# Patient Record
Sex: Female | Born: 1970 | Race: White | Hispanic: No | Marital: Married | Smoking: Current every day smoker
Health system: Southern US, Community
[De-identification: ages and names within clinical notes are randomized; demographics above are authoritative.]

## PROBLEM LIST (undated history)

## (undated) DIAGNOSIS — R52 Pain, unspecified: Secondary | ICD-10-CM

## (undated) DIAGNOSIS — F319 Bipolar disorder, unspecified: Secondary | ICD-10-CM

## (undated) DIAGNOSIS — F39 Unspecified mood [affective] disorder: Secondary | ICD-10-CM

## (undated) DIAGNOSIS — E7800 Pure hypercholesterolemia, unspecified: Secondary | ICD-10-CM

## (undated) DIAGNOSIS — M549 Dorsalgia, unspecified: Secondary | ICD-10-CM

## (undated) DIAGNOSIS — I10 Essential (primary) hypertension: Secondary | ICD-10-CM

## (undated) DIAGNOSIS — E119 Type 2 diabetes mellitus without complications: Secondary | ICD-10-CM

## (undated) DIAGNOSIS — F419 Anxiety disorder, unspecified: Secondary | ICD-10-CM

## (undated) DIAGNOSIS — M5090 Cervical disc disorder, unspecified, unspecified cervical region: Secondary | ICD-10-CM

## (undated) HISTORY — DX: Anxiety disorder, unspecified: F41.9

## (undated) HISTORY — PX: CHOLECYSTECTOMY: SHX55

## (undated) HISTORY — PX: TUBAL LIGATION: SHX77

## (undated) HISTORY — DX: Bipolar disorder, unspecified: F31.9

## (undated) HISTORY — DX: Type 2 diabetes mellitus without complications: E11.9

---

## 2009-08-18 ENCOUNTER — Emergency Department (HOSPITAL_COMMUNITY): Admission: EM | Admit: 2009-08-18 | Discharge: 2009-08-19 | Payer: Self-pay | Admitting: Emergency Medicine

## 2009-12-26 ENCOUNTER — Emergency Department (HOSPITAL_COMMUNITY): Admission: EM | Admit: 2009-12-26 | Discharge: 2009-12-26 | Payer: Self-pay | Admitting: Emergency Medicine

## 2010-01-16 ENCOUNTER — Emergency Department (HOSPITAL_COMMUNITY): Admission: EM | Admit: 2010-01-16 | Discharge: 2010-01-16 | Payer: Self-pay | Admitting: Emergency Medicine

## 2010-02-14 ENCOUNTER — Encounter: Admission: RE | Admit: 2010-02-14 | Discharge: 2010-02-14 | Payer: Self-pay | Admitting: *Deleted

## 2010-02-15 ENCOUNTER — Emergency Department (HOSPITAL_COMMUNITY): Admission: EM | Admit: 2010-02-15 | Discharge: 2010-02-15 | Payer: Self-pay | Admitting: Emergency Medicine

## 2010-02-21 ENCOUNTER — Emergency Department (HOSPITAL_COMMUNITY): Admission: EM | Admit: 2010-02-21 | Discharge: 2010-02-21 | Payer: Self-pay | Admitting: Emergency Medicine

## 2010-04-29 ENCOUNTER — Emergency Department (HOSPITAL_COMMUNITY): Admission: EM | Admit: 2010-04-29 | Discharge: 2010-04-30 | Payer: Self-pay | Admitting: Emergency Medicine

## 2010-04-29 ENCOUNTER — Emergency Department (HOSPITAL_COMMUNITY): Admission: EM | Admit: 2010-04-29 | Discharge: 2010-04-29 | Payer: Self-pay | Admitting: Family Medicine

## 2010-05-12 ENCOUNTER — Emergency Department (HOSPITAL_COMMUNITY): Admission: EM | Admit: 2010-05-12 | Discharge: 2010-05-12 | Payer: Self-pay | Admitting: Emergency Medicine

## 2010-09-09 ENCOUNTER — Emergency Department (HOSPITAL_COMMUNITY)
Admission: EM | Admit: 2010-09-09 | Discharge: 2010-09-09 | Payer: Self-pay | Source: Home / Self Care | Admitting: Emergency Medicine

## 2010-10-06 ENCOUNTER — Encounter: Payer: Self-pay | Admitting: Orthopedic Surgery

## 2010-11-22 ENCOUNTER — Emergency Department (HOSPITAL_COMMUNITY): Payer: Medicare Other

## 2010-11-22 ENCOUNTER — Emergency Department (HOSPITAL_COMMUNITY)
Admission: EM | Admit: 2010-11-22 | Discharge: 2010-11-23 | Disposition: A | Discharge status: Home / Self Care | Payer: Medicare Other | Attending: Emergency Medicine | Admitting: Emergency Medicine

## 2010-11-22 DIAGNOSIS — F319 Bipolar disorder, unspecified: Secondary | ICD-10-CM | POA: Insufficient documentation

## 2010-11-22 DIAGNOSIS — Y92009 Unspecified place in unspecified non-institutional (private) residence as the place of occurrence of the external cause: Secondary | ICD-10-CM | POA: Insufficient documentation

## 2010-11-22 DIAGNOSIS — M25579 Pain in unspecified ankle and joints of unspecified foot: Secondary | ICD-10-CM | POA: Insufficient documentation

## 2010-11-22 DIAGNOSIS — E119 Type 2 diabetes mellitus without complications: Secondary | ICD-10-CM | POA: Insufficient documentation

## 2010-11-22 DIAGNOSIS — W010XXA Fall on same level from slipping, tripping and stumbling without subsequent striking against object, initial encounter: Secondary | ICD-10-CM | POA: Insufficient documentation

## 2010-11-22 DIAGNOSIS — E7800 Pure hypercholesterolemia, unspecified: Secondary | ICD-10-CM | POA: Insufficient documentation

## 2010-11-22 DIAGNOSIS — M79609 Pain in unspecified limb: Secondary | ICD-10-CM | POA: Insufficient documentation

## 2010-11-22 DIAGNOSIS — M129 Arthropathy, unspecified: Secondary | ICD-10-CM | POA: Insufficient documentation

## 2010-11-22 DIAGNOSIS — Z794 Long term (current) use of insulin: Secondary | ICD-10-CM | POA: Insufficient documentation

## 2010-11-22 DIAGNOSIS — F341 Dysthymic disorder: Secondary | ICD-10-CM | POA: Insufficient documentation

## 2010-11-29 LAB — POCT URINALYSIS DIPSTICK
Bilirubin Urine: NEGATIVE
Glucose, UA: NEGATIVE mg/dL
pH: 6 (ref 5.0–8.0)

## 2010-11-29 LAB — GC/CHLAMYDIA PROBE AMP, GENITAL: GC Probe Amp, Genital: NEGATIVE

## 2010-12-21 ENCOUNTER — Emergency Department (HOSPITAL_COMMUNITY)
Admission: EM | Admit: 2010-12-21 | Discharge: 2010-12-21 | Disposition: A | Discharge status: Home / Self Care | Payer: Medicare Other | Attending: Emergency Medicine | Admitting: Emergency Medicine

## 2010-12-21 DIAGNOSIS — M549 Dorsalgia, unspecified: Secondary | ICD-10-CM | POA: Insufficient documentation

## 2010-12-21 DIAGNOSIS — E7800 Pure hypercholesterolemia, unspecified: Secondary | ICD-10-CM | POA: Insufficient documentation

## 2010-12-21 DIAGNOSIS — Z794 Long term (current) use of insulin: Secondary | ICD-10-CM | POA: Insufficient documentation

## 2010-12-21 DIAGNOSIS — D500 Iron deficiency anemia secondary to blood loss (chronic): Secondary | ICD-10-CM | POA: Insufficient documentation

## 2010-12-21 DIAGNOSIS — IMO0002 Reserved for concepts with insufficient information to code with codable children: Secondary | ICD-10-CM | POA: Insufficient documentation

## 2010-12-21 DIAGNOSIS — E119 Type 2 diabetes mellitus without complications: Secondary | ICD-10-CM | POA: Insufficient documentation

## 2010-12-21 DIAGNOSIS — F319 Bipolar disorder, unspecified: Secondary | ICD-10-CM | POA: Insufficient documentation

## 2011-02-13 ENCOUNTER — Emergency Department (HOSPITAL_COMMUNITY)
Admission: EM | Admit: 2011-02-13 | Discharge: 2011-02-13 | Disposition: A | Discharge status: Home / Self Care | Payer: Medicare Other | Attending: Emergency Medicine | Admitting: Emergency Medicine

## 2011-02-13 DIAGNOSIS — E119 Type 2 diabetes mellitus without complications: Secondary | ICD-10-CM | POA: Insufficient documentation

## 2011-02-13 DIAGNOSIS — M542 Cervicalgia: Secondary | ICD-10-CM | POA: Insufficient documentation

## 2011-02-13 DIAGNOSIS — M5412 Radiculopathy, cervical region: Secondary | ICD-10-CM | POA: Insufficient documentation

## 2011-02-13 DIAGNOSIS — F17200 Nicotine dependence, unspecified, uncomplicated: Secondary | ICD-10-CM | POA: Insufficient documentation

## 2011-02-13 LAB — GLUCOSE, CAPILLARY: Glucose-Capillary: 286 mg/dL — ABNORMAL HIGH (ref 70–99)

## 2011-04-23 ENCOUNTER — Emergency Department (HOSPITAL_COMMUNITY)
Admission: EM | Admit: 2011-04-23 | Discharge: 2011-04-23 | Disposition: A | Discharge status: Home / Self Care | Payer: Medicare Other | Attending: Emergency Medicine | Admitting: Emergency Medicine

## 2011-04-23 DIAGNOSIS — M542 Cervicalgia: Secondary | ICD-10-CM | POA: Insufficient documentation

## 2011-04-23 DIAGNOSIS — F17200 Nicotine dependence, unspecified, uncomplicated: Secondary | ICD-10-CM | POA: Insufficient documentation

## 2011-04-23 DIAGNOSIS — G8929 Other chronic pain: Secondary | ICD-10-CM | POA: Insufficient documentation

## 2011-04-23 HISTORY — DX: Cervical disc disorder, unspecified, unspecified cervical region: M50.90

## 2011-04-23 MED ORDER — HYDROCODONE-ACETAMINOPHEN 5-325 MG PO TABS: ORAL_TABLET | ORAL | Status: AC

## 2011-04-23 MED ORDER — METHOCARBAMOL 500 MG PO TABS: 500.0000 mg | ORAL_TABLET | Freq: Three times a day (TID) | ORAL | Status: AC

## 2011-04-23 MED ORDER — MORPHINE SULFATE 10 MG/ML IJ SOLN
6.0000 mg | Freq: Once | INTRAMUSCULAR | Status: AC
  Administered 2011-04-23: 6 mg via INTRAMUSCULAR
  Filled 2011-04-23: qty 1

## 2011-04-23 MED ORDER — ONDANSETRON 8 MG PO TBDP
8.0000 mg | ORAL_TABLET | Freq: Once | ORAL | Status: AC
  Administered 2011-04-23: 8 mg via ORAL
  Filled 2011-04-23: qty 1

## 2011-04-23 NOTE — ED Provider Notes (Signed)
History     CSN: [id number] Arrival date & time: 04/23/2011  3:49 PM  Chief Complaint  Patient presents with  . Pain   HPI Comments: Patient c/o chronic neck pain with hx of recent MR of C spine in June that showed a "bulging disc" per patient.  States the pain became worse today after she was reaching up to hang clothes on a clothesline.  States pain is on the left side of her neck and radiates to her shoulder.  She denies fall, numbness, weakness, headaches or chest pain.  Patient is a 40 y.o. female presenting with musculoskeletal pain. The history is provided by the patient.  Muscle Pain This is a recurrent problem. The current episode started today. The problem occurs constantly. The problem has been gradually worsening. Associated symptoms include neck pain. Pertinent negatives include no abdominal pain, arthralgias, change in bowel habit, chest pain, diaphoresis, fever, headaches, joint swelling, nausea, numbness, sore throat, swollen glands, visual change, vomiting or weakness. The symptoms are aggravated by twisting (certain movements). She has tried oral narcotics for the symptoms. The treatment provided mild relief.    Past Medical History  Diagnosis Date  . Disc disorder of cervical region     History reviewed. No pertinent past surgical history.  History reviewed. No pertinent family history.  History  Substance Use Topics  . Smoking status: Current Everyday Smoker    Types: Cigarettes  . Smokeless tobacco: Not on file  . Alcohol Use: No    OB History    Grav Para Term Preterm Abortions TAB SAB Ect Mult Living                  Review of Systems  Constitutional: Negative for fever and diaphoresis.  HENT: Positive for neck pain. Negative for sore throat, neck stiffness and tinnitus.   Respiratory: Negative for chest tightness and shortness of breath.   Cardiovascular: Negative for chest pain.  Gastrointestinal: Negative for nausea, vomiting, abdominal pain and  change in bowel habit.  Musculoskeletal: Negative for back pain, joint swelling, arthralgias and gait problem.  Skin: Negative.   Neurological: Negative for dizziness, weakness, numbness and headaches.  Hematological: Does not bruise/bleed easily.  All other systems reviewed and are negative.    Physical Exam  BP 145/88  Pulse 102  Temp(Src) 97.9 F (36.6 C) (Oral)  Resp 20  Ht 5\' 4"  (1.626 m)  Wt 180 lb (81.647 kg)  BMI 30.90 kg/m2  SpO2 98%  LMP 04/17/2011  Physical Exam  Nursing note and vitals reviewed. Constitutional: She is oriented to person, place, and time. She appears well-developed and well-nourished. No distress.  HENT:  Head: Normocephalic and atraumatic.  Mouth/Throat: Oropharynx is clear and moist.  Eyes: EOM are normal. Pupils are equal, round, and reactive to light.  Neck: Normal range of motion, full passive range of motion without pain and phonation normal. No JVD present. Muscular tenderness present. No spinous process tenderness present. No rigidity. No edema present. No Brudzinski's sign and no Kernig's sign noted. No thyromegaly present.  Cardiovascular: Normal rate and regular rhythm.   Pulmonary/Chest: Effort normal. No respiratory distress. She exhibits no tenderness.  Musculoskeletal: She exhibits tenderness. She exhibits no edema.  Lymphadenopathy:    She has no cervical adenopathy.  Neurological: She is alert and oriented to person, place, and time. She has normal reflexes. She displays normal reflexes. No cranial nerve deficit. She exhibits normal muscle tone. Coordination normal.  Skin: Skin is warm and dry.  ED Course  Procedures  MDM   I have reveiwed pt's MRI c-spine  results from 02/14/10 that shows a disc prortusion on left at C 5-6 w/o cord compression.  Today, pt has ttp of the left cervical paraspinal muscles that radiates to the shoulder.  No weakness or numbness to left arm. NO posterior cervical spine tenderness Distal sensation  intact, CR<2 sec.  Radial pulse is strong bilaterally.  Grips strength is equal.  Pt has full ROM if the left arm.      Sourish Allender L. Bernard, [place] 04/28/11 2027

## 2011-04-23 NOTE — ED Notes (Signed)
Complain of neck pain past hanging up clothes this morning

## 2011-05-03 ENCOUNTER — Emergency Department (HOSPITAL_COMMUNITY): Payer: Medicare Other

## 2011-05-03 ENCOUNTER — Emergency Department (HOSPITAL_COMMUNITY)
Admission: EM | Admit: 2011-05-03 | Discharge: 2011-05-03 | Disposition: A | Discharge status: Home / Self Care | Payer: Medicare Other | Attending: Emergency Medicine | Admitting: Emergency Medicine

## 2011-05-03 DIAGNOSIS — M546 Pain in thoracic spine: Secondary | ICD-10-CM | POA: Insufficient documentation

## 2011-05-03 DIAGNOSIS — S139XXA Sprain of joints and ligaments of unspecified parts of neck, initial encounter: Secondary | ICD-10-CM | POA: Insufficient documentation

## 2011-05-03 DIAGNOSIS — E669 Obesity, unspecified: Secondary | ICD-10-CM | POA: Insufficient documentation

## 2011-05-03 DIAGNOSIS — E119 Type 2 diabetes mellitus without complications: Secondary | ICD-10-CM | POA: Insufficient documentation

## 2011-05-03 DIAGNOSIS — F209 Schizophrenia, unspecified: Secondary | ICD-10-CM | POA: Insufficient documentation

## 2011-05-03 DIAGNOSIS — E7800 Pure hypercholesterolemia, unspecified: Secondary | ICD-10-CM | POA: Insufficient documentation

## 2011-05-03 DIAGNOSIS — R079 Chest pain, unspecified: Secondary | ICD-10-CM | POA: Insufficient documentation

## 2011-05-03 DIAGNOSIS — J984 Other disorders of lung: Secondary | ICD-10-CM | POA: Insufficient documentation

## 2011-05-03 DIAGNOSIS — F319 Bipolar disorder, unspecified: Secondary | ICD-10-CM | POA: Insufficient documentation

## 2011-05-07 ENCOUNTER — Emergency Department (HOSPITAL_COMMUNITY): Payer: Medicare Other

## 2011-05-07 ENCOUNTER — Encounter (HOSPITAL_COMMUNITY): Payer: Self-pay | Admitting: *Deleted

## 2011-05-07 ENCOUNTER — Emergency Department (HOSPITAL_COMMUNITY)
Admission: EM | Admit: 2011-05-07 | Discharge: 2011-05-07 | Disposition: A | Discharge status: Home / Self Care | Payer: Medicare Other | Attending: Emergency Medicine | Admitting: Emergency Medicine

## 2011-05-07 ENCOUNTER — Other Ambulatory Visit: Payer: Self-pay

## 2011-05-07 DIAGNOSIS — F39 Unspecified mood [affective] disorder: Secondary | ICD-10-CM | POA: Insufficient documentation

## 2011-05-07 DIAGNOSIS — I10 Essential (primary) hypertension: Secondary | ICD-10-CM | POA: Insufficient documentation

## 2011-05-07 DIAGNOSIS — S20219A Contusion of unspecified front wall of thorax, initial encounter: Secondary | ICD-10-CM

## 2011-05-07 DIAGNOSIS — E7800 Pure hypercholesterolemia, unspecified: Secondary | ICD-10-CM | POA: Insufficient documentation

## 2011-05-07 DIAGNOSIS — E119 Type 2 diabetes mellitus without complications: Secondary | ICD-10-CM | POA: Insufficient documentation

## 2011-05-07 DIAGNOSIS — S301XXA Contusion of abdominal wall, initial encounter: Secondary | ICD-10-CM | POA: Insufficient documentation

## 2011-05-07 DIAGNOSIS — R51 Headache: Secondary | ICD-10-CM | POA: Insufficient documentation

## 2011-05-07 DIAGNOSIS — R079 Chest pain, unspecified: Secondary | ICD-10-CM | POA: Insufficient documentation

## 2011-05-07 DIAGNOSIS — Z794 Long term (current) use of insulin: Secondary | ICD-10-CM | POA: Insufficient documentation

## 2011-05-07 DIAGNOSIS — Y92410 Unspecified street and highway as the place of occurrence of the external cause: Secondary | ICD-10-CM | POA: Insufficient documentation

## 2011-05-07 HISTORY — DX: Unspecified mood (affective) disorder: F39

## 2011-05-07 HISTORY — DX: Pure hypercholesterolemia, unspecified: E78.00

## 2011-05-07 HISTORY — DX: Essential (primary) hypertension: I10

## 2011-05-07 LAB — CBC
HCT: 45.6 % (ref 36.0–46.0)
Platelets: 178 10*3/uL (ref 150–400)
RDW: 12.4 % (ref 11.5–15.5)
WBC: 8.4 10*3/uL (ref 4.0–10.5)

## 2011-05-07 LAB — COMPREHENSIVE METABOLIC PANEL
ALT: 52 U/L — ABNORMAL HIGH (ref 0–35)
AST: 47 U/L — ABNORMAL HIGH (ref 0–37)
CO2: 27 mEq/L (ref 19–32)
Chloride: 97 mEq/L (ref 96–112)
Sodium: 133 mEq/L — ABNORMAL LOW (ref 135–145)
Total Bilirubin: 0.3 mg/dL (ref 0.3–1.2)

## 2011-05-07 LAB — POCT I-STAT, CHEM 8
Calcium, Ion: 1.18 mmol/L (ref 1.12–1.32)
Chloride: 101 mEq/L (ref 96–112)
Glucose, Bld: 215 mg/dL — ABNORMAL HIGH (ref 70–99)
HCT: 48 % — ABNORMAL HIGH (ref 36.0–46.0)
Hemoglobin: 16.3 g/dL — ABNORMAL HIGH (ref 12.0–15.0)

## 2011-05-07 LAB — DIFFERENTIAL
Basophils Absolute: 0 10*3/uL (ref 0.0–0.1)
Lymphocytes Relative: 35 % (ref 12–46)
Neutro Abs: 4.6 10*3/uL (ref 1.7–7.7)

## 2011-05-07 MED ORDER — HYDROMORPHONE HCL 1 MG/ML IJ SOLN
1.0000 mg | Freq: Once | INTRAMUSCULAR | Status: AC
  Administered 2011-05-07: 1 mg via INTRAVENOUS
  Filled 2011-05-07: qty 1

## 2011-05-07 MED ORDER — SODIUM CHLORIDE 0.9 % IV SOLN
Freq: Once | INTRAVENOUS | Status: AC
  Administered 2011-05-07: 14:00:00 via INTRAVENOUS

## 2011-05-07 MED ORDER — ONDANSETRON HCL 4 MG/2ML IJ SOLN
4.0000 mg | Freq: Once | INTRAMUSCULAR | Status: AC
  Administered 2011-05-07: 4 mg via INTRAVENOUS
  Filled 2011-05-07: qty 2

## 2011-05-07 MED ORDER — IOHEXOL 300 MG/ML  SOLN
100.0000 mL | Freq: Once | INTRAMUSCULAR | Status: AC | PRN
  Administered 2011-05-07: 100 mL via INTRAVENOUS

## 2011-05-07 MED ORDER — HYDROMORPHONE HCL 2 MG PO TABS: 2.0000 mg | ORAL_TABLET | ORAL | Status: AC | PRN
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
## 2011-05-07 NOTE — ED Provider Notes (Signed)
Medical screening examination/treatment/procedure(s) were performed by non-physician practitioner and as supervising physician I was immediately available for consultation/collaboration.  Blaike Vickers K Rosangela Fehrenbach-Rasch, MD 05/07/11 0441 

## 2011-05-07 NOTE — ED Notes (Signed)
Pt to CT

## 2011-05-15 ENCOUNTER — Emergency Department (HOSPITAL_COMMUNITY)
Admission: EM | Admit: 2011-05-15 | Discharge: 2011-05-15 | Disposition: A | Discharge status: Home / Self Care | Payer: Medicare Other | Attending: Emergency Medicine | Admitting: Emergency Medicine

## 2011-05-15 ENCOUNTER — Emergency Department (HOSPITAL_COMMUNITY): Payer: Medicare Other

## 2011-05-15 ENCOUNTER — Encounter (HOSPITAL_COMMUNITY): Payer: Self-pay | Admitting: Emergency Medicine

## 2011-05-15 DIAGNOSIS — I10 Essential (primary) hypertension: Secondary | ICD-10-CM | POA: Insufficient documentation

## 2011-05-15 DIAGNOSIS — Y92410 Unspecified street and highway as the place of occurrence of the external cause: Secondary | ICD-10-CM | POA: Insufficient documentation

## 2011-05-15 DIAGNOSIS — Z79899 Other long term (current) drug therapy: Secondary | ICD-10-CM | POA: Insufficient documentation

## 2011-05-15 DIAGNOSIS — E7800 Pure hypercholesterolemia, unspecified: Secondary | ICD-10-CM | POA: Insufficient documentation

## 2011-05-15 DIAGNOSIS — E119 Type 2 diabetes mellitus without complications: Secondary | ICD-10-CM | POA: Insufficient documentation

## 2011-05-15 DIAGNOSIS — S2220XA Unspecified fracture of sternum, initial encounter for closed fracture: Secondary | ICD-10-CM

## 2011-05-15 MED ORDER — HYDROMORPHONE HCL 4 MG PO TABS: 4.0000 mg | ORAL_TABLET | ORAL | Status: DC | PRN

## 2011-05-15 MED ORDER — CYCLOBENZAPRINE HCL 10 MG PO TABS: 10.0000 mg | ORAL_TABLET | Freq: Two times a day (BID) | ORAL | Status: AC | PRN

## 2011-05-15 MED ORDER — HYDROMORPHONE HCL 2 MG PO TABS: 4.0000 mg | ORAL_TABLET | Freq: Four times a day (QID) | ORAL | Status: DC | PRN

## 2011-05-15 MED ORDER — HYDROMORPHONE HCL 2 MG/ML IJ SOLN
2.0000 mg | Freq: Once | INTRAMUSCULAR | Status: AC
  Administered 2011-05-15: 2 mg via INTRAVENOUS
  Filled 2011-05-15: qty 1
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
## 2011-06-04 ENCOUNTER — Emergency Department (HOSPITAL_COMMUNITY)
Admission: EM | Admit: 2011-06-04 | Discharge: 2011-06-04 | Disposition: A | Discharge status: Home / Self Care | Payer: Medicare Other | Attending: Emergency Medicine | Admitting: Emergency Medicine

## 2011-06-04 ENCOUNTER — Encounter (HOSPITAL_COMMUNITY): Payer: Self-pay | Admitting: *Deleted

## 2011-06-04 ENCOUNTER — Emergency Department (HOSPITAL_COMMUNITY): Payer: Medicare Other

## 2011-06-04 DIAGNOSIS — I10 Essential (primary) hypertension: Secondary | ICD-10-CM | POA: Insufficient documentation

## 2011-06-04 DIAGNOSIS — E7800 Pure hypercholesterolemia, unspecified: Secondary | ICD-10-CM | POA: Insufficient documentation

## 2011-06-04 DIAGNOSIS — M5090 Cervical disc disorder, unspecified, unspecified cervical region: Secondary | ICD-10-CM | POA: Insufficient documentation

## 2011-06-04 DIAGNOSIS — R072 Precordial pain: Secondary | ICD-10-CM | POA: Insufficient documentation

## 2011-06-04 DIAGNOSIS — E119 Type 2 diabetes mellitus without complications: Secondary | ICD-10-CM | POA: Insufficient documentation

## 2011-06-04 DIAGNOSIS — R0789 Other chest pain: Secondary | ICD-10-CM

## 2011-06-04 DIAGNOSIS — Z794 Long term (current) use of insulin: Secondary | ICD-10-CM | POA: Insufficient documentation

## 2011-06-04 MED ORDER — MORPHINE SULFATE 10 MG/ML IJ SOLN
8.0000 mg | Freq: Once | INTRAMUSCULAR | Status: AC
  Administered 2011-06-04: 8 mg via INTRAMUSCULAR
  Filled 2011-06-04: qty 1

## 2011-06-04 MED ORDER — PROMETHAZINE HCL 25 MG/ML IJ SOLN
25.0000 mg | Freq: Once | INTRAMUSCULAR | Status: AC
  Administered 2011-06-04: 25 mg via INTRAVENOUS
  Filled 2011-06-04: qty 1

## 2011-06-04 MED ORDER — OXYCODONE-ACETAMINOPHEN 5-325 MG PO TABS: 2.0000 | ORAL_TABLET | ORAL | Status: DC | PRN

## 2011-06-04 MED ORDER — OXYCODONE-ACETAMINOPHEN 5-325 MG PO TABS: 2.0000 | ORAL_TABLET | ORAL | Status: AC | PRN
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
## 2011-07-28 ENCOUNTER — Emergency Department (HOSPITAL_COMMUNITY)
Admission: EM | Admit: 2011-07-28 | Discharge: 2011-07-28 | Disposition: A | Discharge status: Home / Self Care | Payer: Medicare Other | Attending: Emergency Medicine | Admitting: Emergency Medicine

## 2011-07-28 ENCOUNTER — Other Ambulatory Visit: Payer: Self-pay

## 2011-07-28 ENCOUNTER — Encounter (HOSPITAL_COMMUNITY): Payer: Self-pay | Admitting: Emergency Medicine

## 2011-07-28 ENCOUNTER — Emergency Department (HOSPITAL_COMMUNITY): Payer: Medicare Other

## 2011-07-28 DIAGNOSIS — Z87828 Personal history of other (healed) physical injury and trauma: Secondary | ICD-10-CM | POA: Insufficient documentation

## 2011-07-28 DIAGNOSIS — F39 Unspecified mood [affective] disorder: Secondary | ICD-10-CM | POA: Insufficient documentation

## 2011-07-28 DIAGNOSIS — Z794 Long term (current) use of insulin: Secondary | ICD-10-CM | POA: Insufficient documentation

## 2011-07-28 DIAGNOSIS — E7800 Pure hypercholesterolemia, unspecified: Secondary | ICD-10-CM | POA: Insufficient documentation

## 2011-07-28 DIAGNOSIS — E119 Type 2 diabetes mellitus without complications: Secondary | ICD-10-CM | POA: Insufficient documentation

## 2011-07-28 DIAGNOSIS — R0789 Other chest pain: Secondary | ICD-10-CM | POA: Insufficient documentation

## 2011-07-28 DIAGNOSIS — M5090 Cervical disc disorder, unspecified, unspecified cervical region: Secondary | ICD-10-CM | POA: Insufficient documentation

## 2011-07-28 DIAGNOSIS — I10 Essential (primary) hypertension: Secondary | ICD-10-CM | POA: Insufficient documentation

## 2011-07-28 LAB — BASIC METABOLIC PANEL
BUN: 6 mg/dL (ref 6–23)
Calcium: 9 mg/dL (ref 8.4–10.5)
GFR calc Af Amer: >90 mL/min (ref >90)
GFR calc non Af Amer: >90 mL/min (ref >90)
Potassium: 3.6 mEq/L (ref 3.5–5.1)

## 2011-07-28 LAB — CBC
HCT: 45.2 % (ref 36.0–46.0)
MCHC: 34.7 g/dL (ref 30.0–36.0)
RDW: 12.4 % (ref 11.5–15.5)

## 2011-07-28 LAB — POCT I-STAT TROPONIN I: Troponin i, poc: 0 ng/mL (ref 0.00–0.08)

## 2011-07-28 MED ORDER — HYDROCODONE-ACETAMINOPHEN 5-325 MG PO TABS: 1.0000 | ORAL_TABLET | ORAL | Status: AC | PRN

## 2011-07-28 MED ORDER — IBUPROFEN 800 MG PO TABS: 800.0000 mg | ORAL_TABLET | Freq: Three times a day (TID) | ORAL | Status: AC

## 2011-07-28 MED ORDER — HYDROCODONE-ACETAMINOPHEN 5-325 MG PO TABS
1.0000 | ORAL_TABLET | Freq: Once | ORAL | Status: AC
  Administered 2011-07-28: 1 via ORAL
  Filled 2011-07-28: qty 1

## 2011-07-28 MED ORDER — IBUPROFEN 800 MG PO TABS
800.0000 mg | ORAL_TABLET | Freq: Once | ORAL | Status: AC
  Administered 2011-07-28: 800 mg via ORAL
  Filled 2011-07-28: qty 1

## 2011-07-28 NOTE — ED Notes (Signed)
Pt back from xray, nad noted
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
## 2011-07-28 NOTE — ED Notes (Signed)
Pt c/o chest pain since yesterday, pt describes pain as a heaviness and feels sob at times.

## 2011-08-29 ENCOUNTER — Encounter (HOSPITAL_COMMUNITY): Payer: Self-pay

## 2011-08-29 ENCOUNTER — Emergency Department (HOSPITAL_COMMUNITY)
Admission: EM | Admit: 2011-08-29 | Discharge: 2011-08-30 | Disposition: A | Discharge status: Home / Self Care | Payer: Medicare Other | Attending: Emergency Medicine | Admitting: Emergency Medicine

## 2011-08-29 DIAGNOSIS — Z794 Long term (current) use of insulin: Secondary | ICD-10-CM | POA: Insufficient documentation

## 2011-08-29 DIAGNOSIS — M5090 Cervical disc disorder, unspecified, unspecified cervical region: Secondary | ICD-10-CM | POA: Insufficient documentation

## 2011-08-29 DIAGNOSIS — M79604 Pain in right leg: Secondary | ICD-10-CM

## 2011-08-29 DIAGNOSIS — Z9889 Other specified postprocedural states: Secondary | ICD-10-CM | POA: Insufficient documentation

## 2011-08-29 DIAGNOSIS — E7800 Pure hypercholesterolemia, unspecified: Secondary | ICD-10-CM | POA: Insufficient documentation

## 2011-08-29 DIAGNOSIS — F39 Unspecified mood [affective] disorder: Secondary | ICD-10-CM | POA: Insufficient documentation

## 2011-08-29 DIAGNOSIS — M79609 Pain in unspecified limb: Secondary | ICD-10-CM | POA: Insufficient documentation

## 2011-08-29 DIAGNOSIS — I10 Essential (primary) hypertension: Secondary | ICD-10-CM | POA: Insufficient documentation

## 2011-08-29 DIAGNOSIS — E119 Type 2 diabetes mellitus without complications: Secondary | ICD-10-CM | POA: Insufficient documentation

## 2011-08-29 DIAGNOSIS — F17200 Nicotine dependence, unspecified, uncomplicated: Secondary | ICD-10-CM | POA: Insufficient documentation

## 2011-08-29 MED ORDER — HYDROMORPHONE HCL PF 2 MG/ML IJ SOLN
2.0000 mg | Freq: Once | INTRAMUSCULAR | Status: AC
  Administered 2011-08-29: 2 mg via INTRAMUSCULAR
  Filled 2011-08-29: qty 1

## 2011-08-29 MED ORDER — OXYCODONE-ACETAMINOPHEN 5-325 MG PO TABS: 2.0000 | ORAL_TABLET | ORAL | Status: AC | PRN

## 2011-08-29 NOTE — ED Notes (Signed)
Pt states her pain management doctor was supposed to call in a medication for pain today but did not.

## 2011-08-29 NOTE — ED Notes (Signed)
Pain bil legs from hips to knees. No known injury.  Says her MD was supposed to call out meds for pain , but did not.  Alert, NAD, color good.

## 2011-08-29 NOTE — ED Notes (Signed)
Pt presents with bilateral leg pain. Pt states pain starts at hip and radiates to knees. Pt ambulated with steady gate.

## 2011-08-29 NOTE — ED Provider Notes (Signed)
Scribed for Donnetta Hutching, MD, the patient was seen in room APA03/APA03 . This chart was scribed by Ellie Lunch.   CSN: [id number] Arrival date & time: 08/29/2011  9:26 PM   First MD Initiated Contact with Patient 08/29/11 2136      Chief Complaint  Patient presents with  . Leg Pain    (Consider location/radiation/quality/duration/timing/severity/associated sxs/prior treatment) HPI .Jaclyn Cruz is a 40 y.o. female who presents to the Emergency Department complaining of 2 days of bilateral leg pain. Pain is located at hips and radiates to knees. Reports some associated lower back pain. Denies any other pain. Denies recent fever. Pt treats pain with Lyrica with mild improvement.  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  PCP [facility] and Dr. Gerilyn Pilgrim for pain management.   Past Medical History  Diagnosis Date  . Disc disorder of cervical region   . Hypertension   . High cholesterol   . Mood disorder   . Diabetes mellitus     Past Surgical History  Procedure Date  . Cholecystectomy     No family history on file.  History  Substance Use Topics  . Smoking status: Current Everyday Smoker    Types: Cigarettes  . Smokeless tobacco: Not on file  . Alcohol Use: No     Review of Systems 10 Systems reviewed and are negative for acute change except as noted in the HPI.  Allergies  Wheat; Soybean-containing drug products; Tape; Codeine; and Compazine  Home Medications   Current Outpatient Rx  Name Route Sig Dispense Refill  . CARISOPRODOL 350 MG PO TABS Oral Take 350 mg by mouth every 6 (six) hours.      Marland Kitchen VITAMIN D 1000 UNITS PO CAPS Oral Take 1,000 Units by mouth daily.      Marland Kitchen CLONAZEPAM 0.5 MG PO TABS Oral Take 0.5 mg by mouth 2 (two) times daily.      . INSULIN GLARGINE 100 UNIT/ML [place] SOLN Subcutaneous Inject 60 Units into the skin at bedtime.      . INSULIN LISPRO (HUMAN) 100 UNIT/ML [place] SOLN Subcutaneous Inject 10-15 Units into  the skin daily as needed. Per sliding scale. Pt will not exceed 15 units.     . MULTIVITAMIN PO Oral Take 1 tablet by mouth daily.      Marland Kitchen NAPROXEN 500 MG PO TABS Oral Take 500 mg by mouth daily as needed. For pain     . PREGABALIN 150 MG PO CAPS Oral Take 150 mg by mouth 2 (two) times daily.        BP 137/76  Pulse 108  Temp(Src) 98.3 F (36.8 C) (Oral)  Resp 20  Ht 5\' 4"  (1.626 m)  Wt 190 lb (86.183 kg)  BMI 32.61 kg/m2  SpO2 97%  LMP 08/29/2011  Physical Exam  Nursing note and vitals reviewed. Constitutional: She is oriented to person, place, and time. She appears well-developed and well-nourished.  HENT:  Head: Normocephalic and atraumatic.  Eyes: Conjunctivae and EOM are normal. Pupils are equal, round, and reactive to light.  Neck: Normal range of motion. Neck supple.  Cardiovascular: Normal rate and regular rhythm.   Pulmonary/Chest: Effort normal and breath sounds normal.  Abdominal: Soft. Bowel sounds are normal.  Musculoskeletal: Normal range of motion.       Minimal tenderness to bilateral legs from hips to knees.   Neurological: She is alert and oriented to person, place, and time. Coordination and gait normal.  Skin:  Skin is warm and dry.  Psychiatric: She has a normal mood and affect.    ED Course  Procedures (including critical care time) DIAGNOSTIC STUDIES: Oxygen Saturation is 97% on room air, normal by my interpretation.    COORDINATION OF CARE:  Labs Reviewed - No data to display No results found.  9:51 PM EDP at Pt bedside. Discussed plan to treat pain.   No diagnosis found.    MDM  Patient is ambulatory. No neuro deficits. Bowel bladder incontinence. No clinical evidence of DVT  I personally performed the services described in this documentation, which was scribed in my presence. The recorded information has been reviewed and considered.        Donnetta Hutching, MD 08/29/11 [phone]

## 2011-10-18 ENCOUNTER — Encounter (HOSPITAL_COMMUNITY): Payer: Self-pay | Admitting: *Deleted

## 2011-10-18 ENCOUNTER — Emergency Department (HOSPITAL_COMMUNITY): Payer: Medicare Other

## 2011-10-18 ENCOUNTER — Emergency Department (HOSPITAL_COMMUNITY)
Admission: EM | Admit: 2011-10-18 | Discharge: 2011-10-19 | Disposition: A | Discharge status: Home / Self Care | Payer: Medicare Other | Attending: Emergency Medicine | Admitting: Emergency Medicine

## 2011-10-18 DIAGNOSIS — M542 Cervicalgia: Secondary | ICD-10-CM | POA: Insufficient documentation

## 2011-10-18 DIAGNOSIS — E119 Type 2 diabetes mellitus without complications: Secondary | ICD-10-CM | POA: Insufficient documentation

## 2011-10-18 DIAGNOSIS — M25519 Pain in unspecified shoulder: Secondary | ICD-10-CM | POA: Insufficient documentation

## 2011-10-18 DIAGNOSIS — I10 Essential (primary) hypertension: Secondary | ICD-10-CM | POA: Insufficient documentation

## 2011-10-18 DIAGNOSIS — M5090 Cervical disc disorder, unspecified, unspecified cervical region: Secondary | ICD-10-CM | POA: Insufficient documentation

## 2011-10-18 DIAGNOSIS — E7800 Pure hypercholesterolemia, unspecified: Secondary | ICD-10-CM | POA: Insufficient documentation

## 2011-10-18 MED ORDER — HYDROMORPHONE HCL PF 1 MG/ML IJ SOLN
1.0000 mg | Freq: Once | INTRAMUSCULAR | Status: AC
  Administered 2011-10-18: 1 mg via INTRAMUSCULAR
  Filled 2011-10-18: qty 1

## 2011-10-18 MED ORDER — KETOROLAC TROMETHAMINE 60 MG/2ML IM SOLN
60.0000 mg | Freq: Once | INTRAMUSCULAR | Status: AC
  Administered 2011-10-18: 60 mg via INTRAMUSCULAR
  Filled 2011-10-18: qty 2

## 2011-10-18 NOTE — ED Provider Notes (Signed)
This chart was scribed for [employer]. Colon Branch, MD by Williemae Natter. The patient was seen in room APA05/APA05 at 10:45 PM.  CSN: [id number]  Arrival date & time 10/18/11  2200   First MD Initiated Contact with Patient 10/18/11 2241      Chief Complaint  Patient presents with  . Shoulder Pain    (Consider location/radiation/quality/duration/timing/severity/associated sxs/prior treatment) HPI Jaclyn Cruz is a 41 y.o. female who presents to the Emergency Department complaining of moderate to severe acute onset shoulder pain for the past 5 days. Pt states that the pain radiated down her arm to her elbow over the past few days and has progressively gotten worse. Pt states that she heard a loud "pop" sound in left arm today. Currently treating with Vicodin, tiger balm, and Biofreeze with little to no improvement. Past Medical History  Diagnosis Date  . Disc disorder of cervical region   . Hypertension   . High cholesterol   . Mood disorder   . Diabetes mellitus     Past Surgical History  Procedure Date  . Cholecystectomy     History reviewed. No pertinent family history.  History  Substance Use Topics  . Smoking status: Current Everyday Smoker    Types: Cigarettes  . Smokeless tobacco: Not on file  . Alcohol Use: No    OB History    Grav Para Term Preterm Abortions TAB SAB Ect Mult Living                  Review of Systems 10 Systems reviewed and are negative for acute change except as noted in the HPI.  Allergies  Wheat; Soybean-containing drug products; Tape; Codeine; and Compazine  Home Medications   Current Outpatient Rx  Name Route Sig Dispense Refill  . CARISOPRODOL 350 MG PO TABS Oral Take 350 mg by mouth every 6 (six) hours.      Marland Kitchen CLONAZEPAM 0.5 MG PO TABS Oral Take 0.5 mg by mouth 2 (two) times daily.      Marland Kitchen HYDROCODONE-ACETAMINOPHEN 5-325 MG PO TABS Oral Take 1 tablet by mouth daily.    . INSULIN GLARGINE 100 UNIT/ML [place] SOLN Subcutaneous Inject 60  Units into the skin at bedtime.      . INSULIN LISPRO (HUMAN) 100 UNIT/ML [place] SOLN Subcutaneous Inject 10-15 Units into the skin daily as needed. Per sliding scale. Pt will not exceed 15 units.     . MULTIVITAMIN PO Oral Take 1 tablet by mouth daily.      Marland Kitchen PREGABALIN 300 MG PO CAPS Oral Take 300 mg by mouth 2 (two) times daily.      BP 137/90  Pulse 109  Temp(Src) 97.3 F (36.3 C) (Oral)  Resp 24  Ht 5\' 4"  (1.626 m)  Wt 180 lb (81.647 kg)  BMI 30.90 kg/m2  SpO2 97%  LMP 09/30/2011  Physical Exam  Nursing note and vitals reviewed. Constitutional: She is oriented to person, place, and time. She appears well-developed and well-nourished.  HENT:  Head: Normocephalic and atraumatic.  Eyes: Conjunctivae are normal. Pupils are equal, round, and reactive to light.  Neck: Normal range of motion. Neck supple.  Cardiovascular: Normal rate, regular rhythm and normal heart sounds.        Pulses are 2+ and normal. Brisk cap refill. No change in sensation  Pulmonary/Chest: Effort normal and breath sounds normal.  Abdominal: Soft. There is no tenderness.  Musculoskeletal:       Full range of motion in right shoulder  SCM muscle tenderness and deltoid tenderness to palpation.   Neurological: She is alert and oriented to person, place, and time.  Skin: Skin is warm and dry.    ED Course  Procedures (including critical care time) DIAGNOSTIC STUDIES: Oxygen Saturation is 97% on room air, normal by my interpretation.    COORDINATION OF CARE:  Medications  HYDROmorphone (DILAUDID) injection 1 mg   ketorolac (TORADOL) injection 60 mg    Results for orders placed during the hospital encounter of 07/28/11  CBC      Component Value Range   WBC 8.5  4.0 - 10.5 (K/uL)   RBC 4.93  3.87 - 5.11 (MIL/uL)   Hemoglobin 15.7 (*) 12.0 - 15.0 (g/dL)   HCT 16.1  09.6 - 04.5 (%)   MCV 91.7  78.0 - 100.0 (fL)   MCH 31.8  26.0 - 34.0 (pg)   MCHC 34.7  30.0 - 36.0 (g/dL)   RDW 40.9  81.1 - 91.4 (%)    Platelets 179  150 - 400 (K/uL)  BASIC METABOLIC PANEL      Component Value Range   Sodium 133 (*) 135 - 145 (mEq/L)   Potassium 3.6  3.5 - 5.1 (mEq/L)   Chloride 100  96 - 112 (mEq/L)   CO2 23  19 - 32 (mEq/L)   Glucose, Bld 217 (*) 70 - 99 (mg/dL)   BUN 6  6 - 23 (mg/dL)   Creatinine, Ser 7.82 (*) 0.50 - 1.10 (mg/dL)   Calcium 9.0  8.4 - 95.6 (mg/dL)   GFR calc non Af Amer >90  >90 (mL/min)   GFR calc Af Amer >90  >90 (mL/min)  POCT I-STAT TROPONIN I      Component Value Range   Troponin i, poc 0.00  0.00 - 0.08 (ng/mL)   Comment 3            Dg Shoulder Left  10/18/2011  *RADIOLOGY REPORT*  Clinical Data: Left shoulder pain  LEFT SHOULDER - 2+ VIEW  Comparison: None.  Findings: No acute fracture or dislocation.  No aggressive osseous lesions.  The clavicle is intact.  The left upper lung is clear. The left upper ribs are intact.  IMPRESSION: No acute osseous abnormality.  Original Report Authenticated By: Waneta Martins, M.D.      MDM  Patient with left neck, shoulder,  and arm pain with no known injury. Xray with no acute findings.Patient received analgesics and antiinflammatory with improvement.Pt feels improved after observation and/or treatment in ED.Pt stable in ED with no significant deterioration in condition.The patient appears reasonably screened and/or stabilized for discharge and I doubt any other medical condition or other [facility] requiring further screening, evaluation, or treatment in the ED at this time prior to discharge.  I personally performed the services described in this documentation, which was scribed in my presence. The recorded information has been reviewed and considered.  MDM Reviewed: nursing note and vitals Interpretation: x-ray       Nicoletta Dress. Colon Branch, MD 10/19/11 2130

## 2011-10-18 NOTE — ED Notes (Signed)
Left sided neck pain that started 5 days ago and has moved down into shoulder and left elbow over the last several days, also with cough

## 2011-10-18 NOTE — ED Notes (Signed)
Also heard a loud pop noise from her left arm today

## 2011-10-19 MED ORDER — CYCLOBENZAPRINE HCL 10 MG PO TABS: 10.0000 mg | ORAL_TABLET | Freq: Two times a day (BID) | ORAL | Status: AC

## 2011-10-19 MED ORDER — OXYCODONE-ACETAMINOPHEN 5-325 MG PO TABS: 2.0000 | ORAL_TABLET | ORAL | Status: AC | PRN

## 2012-01-03 ENCOUNTER — Emergency Department (HOSPITAL_COMMUNITY)
Admission: EM | Admit: 2012-01-03 | Discharge: 2012-01-03 | Disposition: A | Discharge status: Home / Self Care | Payer: Medicare Other | Attending: Emergency Medicine | Admitting: Emergency Medicine

## 2012-01-03 ENCOUNTER — Encounter (HOSPITAL_COMMUNITY): Payer: Self-pay | Admitting: *Deleted

## 2012-01-03 DIAGNOSIS — I10 Essential (primary) hypertension: Secondary | ICD-10-CM | POA: Insufficient documentation

## 2012-01-03 DIAGNOSIS — M542 Cervicalgia: Secondary | ICD-10-CM | POA: Insufficient documentation

## 2012-01-03 DIAGNOSIS — M25569 Pain in unspecified knee: Secondary | ICD-10-CM | POA: Insufficient documentation

## 2012-01-03 DIAGNOSIS — Z794 Long term (current) use of insulin: Secondary | ICD-10-CM | POA: Insufficient documentation

## 2012-01-03 DIAGNOSIS — M7989 Other specified soft tissue disorders: Secondary | ICD-10-CM | POA: Insufficient documentation

## 2012-01-03 DIAGNOSIS — M1990 Unspecified osteoarthritis, unspecified site: Secondary | ICD-10-CM

## 2012-01-03 DIAGNOSIS — E7800 Pure hypercholesterolemia, unspecified: Secondary | ICD-10-CM | POA: Insufficient documentation

## 2012-01-03 DIAGNOSIS — E119 Type 2 diabetes mellitus without complications: Secondary | ICD-10-CM | POA: Insufficient documentation

## 2012-01-03 MED ORDER — KETOROLAC TROMETHAMINE 10 MG PO TABS: 10.0000 mg | ORAL_TABLET | Freq: Once | ORAL | Status: DC

## 2012-01-03 MED ORDER — MORPHINE SULFATE 4 MG/ML IJ SOLN
8.0000 mg | Freq: Once | INTRAMUSCULAR | Status: AC
  Administered 2012-01-03: 8 mg via INTRAMUSCULAR
  Filled 2012-01-03: qty 2

## 2012-01-03 MED ORDER — ONDANSETRON HCL 4 MG PO TABS
4.0000 mg | ORAL_TABLET | Freq: Once | ORAL | Status: AC
  Administered 2012-01-03: 4 mg via ORAL
  Filled 2012-01-03: qty 1

## 2012-01-03 NOTE — Discharge Instructions (Signed)
Please use the knee immobilizer when up an about until seen by orthopedic MD listed above or Orthopedic MD of your choice. Please do not sleep in the immobilizer. Continue your current pain management meds.Degenerative Arthritis You have osteoarthritis. This is the wear and tear arthritis that comes with aging. It is also called degenerative arthritis. This is common in people past middle age. It is caused by stress on the joints. The large weight bearing joints of the lower extremities are most often affected. The knees, hips, back, neck, and hands can become painful, swollen, and stiff. This is the most common type of arthritis. It comes on with age, carrying too much weight, or from an injury. Treatment includes resting the sore joint until the pain and swelling improve. Crutches or a walker may be needed for severe flares. Only take over-the-counter or prescription medicines for pain, discomfort, or fever as directed by your caregiver. Local heat therapy may improve motion. Cortisone shots into the joint are sometimes used to reduce pain and swelling during flares. Osteoarthritis is usually not crippling and progresses slowly. There are things you can do to decrease pain:  Avoid high impact activities.   Exercise regularly.   Low impact exercises such as walking, biking and swimming help to keep the muscles strong and keep normal joint function.   Stretching helps to keep your range of motion.   Lose weight if you are overweight. This reduces joint stress.  In severe cases when you have pain at rest or increasing disability, joint surgery may be helpful. See your caregiver for follow-up treatment as recommended.  SEEK IMMEDIATE MEDICAL CARE IF:   You have severe joint pain.   Marked swelling and redness in your joint develops.   You develop a high fever.  Document Released: 09/01/2005 Document Revised: 08/21/2011 Document Reviewed: 02/01/2007 [facility] Patient Information 2012 [place],  [place].

## 2012-01-03 NOTE — ED Notes (Signed)
Pt presents with right knee pain, denies injury/trauma.Pt has iced and elevated knee without relief. Taking 1 Vicodin, muscle relaxants and neurotin daily without pain relief. Pt has pain management appointment on 26 th of April.

## 2012-01-03 NOTE — ED Provider Notes (Signed)
History     CSN: [id number]  Arrival date & time 01/03/12  2151   First MD Initiated Contact with Patient 01/03/12 2233      Chief Complaint  Patient presents with  . Knee Pain    (Consider location/radiation/quality/duration/timing/severity/associated sxs/prior treatment) Patient is a 41 y.o. female presenting with knee pain. The history is provided by the patient.  Knee Pain This is a chronic problem. The current episode started more than 1 month ago. The problem occurs daily. The problem has been gradually worsening. Associated symptoms include joint swelling and neck pain. The symptoms are aggravated by standing and walking.    Past Medical History  Diagnosis Date  . Disc disorder of cervical region   . Hypertension   . High cholesterol   . Mood disorder   . Diabetes mellitus     Past Surgical History  Procedure Date  . Cholecystectomy     No family history on file.  History  Substance Use Topics  . Smoking status: Current Everyday Smoker    Types: Cigarettes  . Smokeless tobacco: Not on file  . Alcohol Use: No    OB History    Grav Para Term Preterm Abortions TAB SAB Ect Mult Living                  Review of Systems  HENT: Positive for neck pain.   Musculoskeletal: Positive for joint swelling.    Allergies  Wheat; Soybean-containing drug products; Tape; Codeine; and Compazine  Home Medications   Current Outpatient Rx  Name Route Sig Dispense Refill  . CARISOPRODOL 350 MG PO TABS Oral Take 350 mg by mouth every 6 (six) hours.      Marland Kitchen CLONAZEPAM 0.5 MG PO TABS Oral Take 0.5 mg by mouth 2 (two) times daily.      Marland Kitchen HYDROCODONE-ACETAMINOPHEN 5-325 MG PO TABS Oral Take 1 tablet by mouth daily.    . INSULIN GLARGINE 100 UNIT/ML [place] SOLN Subcutaneous Inject 60 Units into the skin at bedtime.      . INSULIN LISPRO (HUMAN) 100 UNIT/ML [place] SOLN Subcutaneous Inject 10-15 Units into the skin daily as needed. Per sliding scale. Pt will not exceed 15 units.       . MULTIVITAMIN PO Oral Take 1 tablet by mouth daily.      Marland Kitchen PREGABALIN 300 MG PO CAPS Oral Take 300 mg by mouth 2 (two) times daily.      BP 149/100  Pulse 119  Temp(Src) 98.7 F (37.1 C) (Oral)  Resp 20  Ht 5\' 4"  (1.626 m)  Wt 190 lb (86.183 kg)  BMI 32.61 kg/m2  SpO2 98%  LMP 12/15/2011  Physical Exam  Nursing note and vitals reviewed. Constitutional: She is oriented to person, place, and time. She appears well-developed and well-nourished.  Non-toxic appearance.  HENT:  Head: Normocephalic.  Right Ear: Tympanic membrane and external ear normal.  Left Ear: Tympanic membrane and external ear normal.  Eyes: EOM and lids are normal. Pupils are equal, round, and reactive to light.  Neck: Normal range of motion. Neck supple. Carotid bruit is not present.  Cardiovascular: Normal rate, regular rhythm, normal heart sounds, intact distal pulses and normal pulses.   Pulmonary/Chest: Breath sounds normal. No respiratory distress.  Abdominal: Soft. Bowel sounds are normal. There is no tenderness. There is no guarding.  Musculoskeletal: Normal range of motion.       There is good ROM of the right hip. Pain with flex/extention of the right  knee. No significant effusion. Crepitus noted.. No posterior mass. Distal pulses and sensory wnl.  Lymphadenopathy:       Head (right side): No submandibular adenopathy present.       Head (left side): No submandibular adenopathy present.    She has no cervical adenopathy.  Neurological: She is alert and oriented to person, place, and time. She has normal strength. No cranial nerve deficit or sensory deficit.  Skin: Skin is warm and dry.  Psychiatric: She has a normal mood and affect. Her speech is normal.    ED Course  Procedures (including critical care time)  Labs Reviewed - No data to display No results found.   No diagnosis found.    MDM  I have reviewed nursing notes, vital signs, and all appropriate lab and imaging results for this  patient.* Pt states she has had problem with her knees for some time. He doctors have not given her a diagnosis concerning the knees. She has chronic pain and is treated at a pain management center. Pt is fitted with a knee immobilizer and crutches. IM Morphine given to pt tonight. She is to continue her current pain management until seen by orthopedics.       Kathie Dike, [place] 01/03/12 2314

## 2012-01-03 NOTE — ED Provider Notes (Signed)
Medical screening examination/treatment/procedure(s) were performed by non-physician practitioner and as supervising physician I was immediately available for consultation/collaboration.   Reigna Ruperto L Reginaldo Hazard, MD 01/03/12 2353 

## 2012-05-05 ENCOUNTER — Emergency Department (HOSPITAL_COMMUNITY)
Admission: EM | Admit: 2012-05-05 | Discharge: 2012-05-05 | Disposition: A | Discharge status: Home / Self Care | Payer: Medicare Other | Attending: Emergency Medicine | Admitting: Emergency Medicine

## 2012-05-05 ENCOUNTER — Encounter (HOSPITAL_COMMUNITY): Payer: Self-pay | Admitting: Emergency Medicine

## 2012-05-05 DIAGNOSIS — S91309A Unspecified open wound, unspecified foot, initial encounter: Secondary | ICD-10-CM | POA: Insufficient documentation

## 2012-05-05 DIAGNOSIS — W269XXA Contact with unspecified sharp object(s), initial encounter: Secondary | ICD-10-CM | POA: Insufficient documentation

## 2012-05-05 DIAGNOSIS — I10 Essential (primary) hypertension: Secondary | ICD-10-CM | POA: Insufficient documentation

## 2012-05-05 DIAGNOSIS — T7840XA Allergy, unspecified, initial encounter: Secondary | ICD-10-CM

## 2012-05-05 DIAGNOSIS — Z79899 Other long term (current) drug therapy: Secondary | ICD-10-CM | POA: Insufficient documentation

## 2012-05-05 DIAGNOSIS — Z23 Encounter for immunization: Secondary | ICD-10-CM | POA: Insufficient documentation

## 2012-05-05 DIAGNOSIS — E119 Type 2 diabetes mellitus without complications: Secondary | ICD-10-CM | POA: Insufficient documentation

## 2012-05-05 DIAGNOSIS — F17200 Nicotine dependence, unspecified, uncomplicated: Secondary | ICD-10-CM | POA: Insufficient documentation

## 2012-05-05 DIAGNOSIS — Z794 Long term (current) use of insulin: Secondary | ICD-10-CM | POA: Insufficient documentation

## 2012-05-05 DIAGNOSIS — S91339A Puncture wound without foreign body, unspecified foot, initial encounter: Secondary | ICD-10-CM

## 2012-05-05 DIAGNOSIS — R1310 Dysphagia, unspecified: Secondary | ICD-10-CM | POA: Insufficient documentation

## 2012-05-05 DIAGNOSIS — E7800 Pure hypercholesterolemia, unspecified: Secondary | ICD-10-CM | POA: Insufficient documentation

## 2012-05-05 MED ORDER — DIPHENHYDRAMINE HCL 25 MG PO TABS: 25.0000 mg | ORAL_TABLET | Freq: Four times a day (QID) | ORAL | Status: DC | PRN

## 2012-05-05 MED ORDER — METHYLPREDNISOLONE SODIUM SUCC 125 MG IJ SOLR
125.0000 mg | Freq: Once | INTRAMUSCULAR | Status: AC
  Administered 2012-05-05: 125 mg via INTRAVENOUS
  Filled 2012-05-05: qty 2

## 2012-05-05 MED ORDER — FAMOTIDINE IN NACL 20-0.9 MG/50ML-% IV SOLN
20.0000 mg | INTRAVENOUS | Status: AC
  Administered 2012-05-05: 20 mg via INTRAVENOUS
  Filled 2012-05-05: qty 50

## 2012-05-05 MED ORDER — DIPHENHYDRAMINE HCL 50 MG/ML IJ SOLN
25.0000 mg | Freq: Once | INTRAMUSCULAR | Status: AC
  Administered 2012-05-05: 25 mg via INTRAVENOUS
  Filled 2012-05-05: qty 1

## 2012-05-05 MED ORDER — TETANUS-DIPHTH-ACELL PERTUSSIS 5-2.5-18.5 LF-MCG/0.5 IM SUSP
0.5000 mL | Freq: Once | INTRAMUSCULAR | Status: AC
  Administered 2012-05-05: 0.5 mL via INTRAMUSCULAR
  Filled 2012-05-05: qty 0.5

## 2012-05-05 NOTE — ED Provider Notes (Signed)
History     CSN: [id number]  Arrival date & time 05/05/12  1502   First MD Initiated Contact with Patient 05/05/12 1525      Chief Complaint  Patient presents with  . Snake Bite    (Consider location/radiation/quality/duration/timing/severity/associated sxs/prior treatment) HPI Comments: Jaclyn Cruz presents for evaluation of burning left foot pain.  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  She stepped on something sharp and immediately felt a burning pain on the plantar surface of her foot that has spread to include the entire foot now.  She also reports feeling flushed, with dry mouth and some difficulty swallowing.  She denies any swelling except in her foot, rashes, or shortness of breath.  She did not see any metal objects, insects, snakes or woody debris that may have caused the injury.  She denies ever having a similar reaction in the past.  The history is provided by the patient. No language interpreter was used.    Past Medical History  Diagnosis Date  . Disc disorder of cervical region   . Hypertension   . High cholesterol   . Mood disorder   . Diabetes mellitus     Past Surgical History  Procedure Date  . Cholecystectomy     No family history on file.  History  Substance Use Topics  . Smoking status: Current Everyday Smoker    Types: Cigarettes  . Smokeless tobacco: Not on file  . Alcohol Use: Yes    OB History    Grav Para Term Preterm Abortions TAB SAB Ect Mult Living                  Review of Systems  Constitutional: Negative for fever, chills, diaphoresis, activity change, appetite change and fatigue.  HENT: Positive for trouble swallowing. Negative for congestion, sore throat, facial swelling, rhinorrhea, drooling, neck pain, voice change, sinus pressure and tinnitus.   Eyes: Negative.   Respiratory: Negative.   Cardiovascular: Negative.   Gastrointestinal: Negative.   Genitourinary: Negative.     Musculoskeletal: Negative for back pain, joint swelling, arthralgias and gait problem.  Skin: Negative for color change, pallor, rash and wound.  Neurological: Negative.   Hematological: Negative.   Psychiatric/Behavioral: Negative.     Allergies  Wheat; Soybean-containing drug products; Tape; Codeine; and Compazine  Home Medications   Current Outpatient Rx  Name Route Sig Dispense Refill  . CLONAZEPAM 0.5 MG PO TABS Oral Take 0.5 mg by mouth 2 (two) times daily.      . CYCLOBENZAPRINE HCL 10 MG PO TABS Oral Take 10 mg by mouth 3 (three) times daily.    . DULOXETINE HCL 30 MG PO CPEP Oral Take 30 mg by mouth 2 (two) times daily.    . INSULIN GLARGINE 100 UNIT/ML [place] SOLN Subcutaneous Inject 60 Units into the skin at bedtime.     . INSULIN LISPRO (HUMAN) 100 UNIT/ML [place] SOLN Subcutaneous Inject 16-22 Units into the skin 3 (three) times daily before meals. Per sliding scale. Pt will not exceed 15 units.    Marland Kitchen METFORMIN HCL 500 MG PO TABS Oral Take 500 mg by mouth 2 (two) times daily with a meal.    . OXYCODONE-ACETAMINOPHEN 5-325 MG PO TABS Oral Take 1 tablet by mouth 2 (two) times daily.    Marland Kitchen PREGABALIN 300 MG PO CAPS Oral Take 300 mg by mouth 2 (two) times daily.      BP 132/69  Pulse 98  Temp 97.7 F (36.5 C) (Oral)  SpO2 100%  LMP 04/19/2012  Physical Exam  Constitutional: She is oriented to person, place, and time. She appears well-developed and well-nourished. No distress.  HENT:  Head: Normocephalic and atraumatic. No trismus in the jaw.  Right Ear: External ear normal.  Left Ear: External ear normal.  Nose: Nose normal. No mucosal edema, rhinorrhea or sinus tenderness.  Mouth/Throat: Oropharynx is clear and moist. No oral lesions. No uvula swelling. No oropharyngeal exudate, posterior oropharyngeal edema, posterior oropharyngeal erythema or tonsillar abscesses.  Eyes: Conjunctivae and EOM are normal. Pupils are equal, round, and reactive to light. Right eye exhibits no  discharge. Left eye exhibits no discharge. No scleral icterus.  Neck: Normal range of motion. Neck supple. No JVD present. No tracheal deviation present. No thyromegaly present.  Cardiovascular: Normal rate, regular rhythm, normal heart sounds and intact distal pulses.  Exam reveals no gallop and no friction rub.   No murmur heard. Pulmonary/Chest: Effort normal and breath sounds normal. No stridor. No respiratory distress. She has no wheezes. She has no rales. She exhibits no tenderness.  Abdominal: Soft. Bowel sounds are normal. She exhibits no distension and no mass. There is no tenderness. There is no rebound and no guarding.  Musculoskeletal: Normal range of motion. She exhibits tenderness. She exhibits no edema.       Site marked ion ball of foot just proximal to left great toe.  Tenderness reproduced with palpation.  Unable to appreciate any skin defects.  Not localized swelling appreciated.  No redness or palpable FBs.  Lymphadenopathy:    She has no cervical adenopathy.  Neurological: She is alert and oriented to person, place, and time.  Skin: Skin is warm and dry. She is not diaphoretic.       No wounds, skin changes, or swelling noted on inspection of foot.  Psychiatric: She has a normal mood and affect. Her behavior is normal. Thought content normal.    ED Course  Procedures (including critical care time)  Labs Reviewed - No data to display No results found.   No diagnosis found.    MDM  Pt presents for evaluation of left foot pain.  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  She appears comfortable but describes burning in her foot, dry mouth, and difficulty swallowing.  Unable to even appreciate a puncture of the skin on exam.  No visible swelling or asymmetry when comparing it to the other foot.  Concerned that she may be describing a localized and systemic hypersensitivity reaction.  Plan update tetanus,  benadryl/solumedrol/pepcid, ane ER obs.  Will reassess periodically.  1900.  Pain improved.  Tetanus updated.  Symptoms of difficulty swallowing also resolved.  Foot again inspected and no swelling or erythema appreciated.  Plan d/c home.  Discussed indications for immediate return to the emergency department prior to pt discharge.      Tobin Chad, MD 05/05/12 1925

## 2012-05-05 NOTE — ED Notes (Signed)
PER EMS- was called out to pt's home with c/o snake bite on L foot.   Pt stated she was walking [place] and felt something sharp puncture the bottom of her left foot with immediate burning.  Pt suspect that it was a snake puncture, but isn't sure.  Alert and oriented, ems states patient was able to put pressure on foot during transportation.  No redness, swelling, or puncture wound noted.

## 2012-05-05 NOTE — ED Notes (Signed)
OZH:[id number]<HQ> Expected date:05/05/12<BR> Expected time: 2:38 PM<BR> Means of arrival:Ambulance<BR> Comments:<BR> Puncture wound to bottom of foot

## 2012-05-06 ENCOUNTER — Emergency Department (HOSPITAL_COMMUNITY)
Admission: EM | Admit: 2012-05-06 | Discharge: 2012-05-06 | Disposition: A | Discharge status: Home / Self Care | Payer: Medicare Other | Attending: Emergency Medicine | Admitting: Emergency Medicine

## 2012-05-06 ENCOUNTER — Encounter (HOSPITAL_COMMUNITY): Payer: Self-pay | Admitting: *Deleted

## 2012-05-06 DIAGNOSIS — E1142 Type 2 diabetes mellitus with diabetic polyneuropathy: Secondary | ICD-10-CM | POA: Insufficient documentation

## 2012-05-06 DIAGNOSIS — E7800 Pure hypercholesterolemia, unspecified: Secondary | ICD-10-CM | POA: Insufficient documentation

## 2012-05-06 DIAGNOSIS — F39 Unspecified mood [affective] disorder: Secondary | ICD-10-CM | POA: Insufficient documentation

## 2012-05-06 DIAGNOSIS — Z794 Long term (current) use of insulin: Secondary | ICD-10-CM | POA: Insufficient documentation

## 2012-05-06 DIAGNOSIS — I10 Essential (primary) hypertension: Secondary | ICD-10-CM | POA: Insufficient documentation

## 2012-05-06 DIAGNOSIS — F17200 Nicotine dependence, unspecified, uncomplicated: Secondary | ICD-10-CM | POA: Insufficient documentation

## 2012-05-06 DIAGNOSIS — Z79899 Other long term (current) drug therapy: Secondary | ICD-10-CM | POA: Insufficient documentation

## 2012-05-06 DIAGNOSIS — E1149 Type 2 diabetes mellitus with other diabetic neurological complication: Secondary | ICD-10-CM | POA: Insufficient documentation

## 2012-05-06 DIAGNOSIS — R21 Rash and other nonspecific skin eruption: Secondary | ICD-10-CM | POA: Insufficient documentation

## 2012-05-06 DIAGNOSIS — M79672 Pain in left foot: Secondary | ICD-10-CM

## 2012-05-06 DIAGNOSIS — E1140 Type 2 diabetes mellitus with diabetic neuropathy, unspecified: Secondary | ICD-10-CM

## 2012-05-06 MED ORDER — ONDANSETRON HCL 4 MG PO TABS
4.0000 mg | ORAL_TABLET | Freq: Once | ORAL | Status: AC
  Administered 2012-05-06: 4 mg via ORAL
  Filled 2012-05-06: qty 1

## 2012-05-06 MED ORDER — HYDROCODONE-ACETAMINOPHEN 5-325 MG PO TABS: 2.0000 | ORAL_TABLET | Freq: Once | ORAL | Status: DC

## 2012-05-06 MED ORDER — HYDROXYZINE HCL 25 MG PO TABS
50.0000 mg | ORAL_TABLET | Freq: Once | ORAL | Status: AC
  Administered 2012-05-06: 50 mg via ORAL

## 2012-05-06 MED ORDER — HYDROXYZINE PAMOATE 25 MG PO CAPS: ORAL_CAPSULE | ORAL | Status: DC

## 2012-05-06 MED ORDER — OXYCODONE-ACETAMINOPHEN 5-325 MG PO TABS
1.0000 | ORAL_TABLET | Freq: Once | ORAL | Status: AC
  Administered 2012-05-06: 1 via ORAL
  Filled 2012-05-06: qty 1

## 2012-05-06 MED ORDER — HYDROXYZINE HCL 25 MG PO TABS
ORAL_TABLET | ORAL | Status: AC
  Administered 2012-05-06: 50 mg via ORAL
  Filled 2012-05-06: qty 2

## 2012-05-06 MED ORDER — OXYCODONE-ACETAMINOPHEN 5-325 MG PO TABS: 1.0000 | ORAL_TABLET | Freq: Four times a day (QID) | ORAL | Status: AC | PRN

## 2012-05-06 MED ORDER — HYDROXYZINE HCL 50 MG/ML IM SOLN: 50.0000 mg | Freq: Once | INTRAMUSCULAR | Status: DC

## 2012-05-06 NOTE — ED Provider Notes (Signed)
History     CSN: [id number]  Arrival date & time 05/06/12  2030   None     Chief Complaint  Patient presents with  . Leg Pain    (Consider location/radiation/quality/duration/timing/severity/associated sxs/prior treatment) HPI Comments: Patient states that on August 21 [place] when she either stepped on something or something bit her on the left foot. Following this she had pain in the foot that was radiating up to the left hip. The patient was seen at an emergency department where it was felt that she had A. sensitivity issue. She was given Benadryl and prednisone and observed in the emergency department. When she left the pain hadn't improved and there were no signs of any progressing allergic type reactions. The patient states that today she has been having increasing pain radiating from the foot up to the leg. She states she has now noticed a red rash present there. And she also thinks that the second toe of the left foot seems to be more swollen and may have a" black dot on it". The patient presents now for additional evaluation of this problem. It is of note that the patient is an insulin requiring diabetic.   The history is provided by the patient.    Past Medical History  Diagnosis Date  . Disc disorder of cervical region   . Hypertension   . High cholesterol   . Mood disorder   . Diabetes mellitus     Past Surgical History  Procedure Date  . Cholecystectomy     History reviewed. No pertinent family history.  History  Substance Use Topics  . Smoking status: Current Everyday Smoker    Types: Cigarettes  . Smokeless tobacco: Not on file  . Alcohol Use: Yes    OB History    Grav Para Term Preterm Abortions TAB SAB Ect Mult Living                  Review of Systems  Constitutional: Negative for activity change.       All ROS Neg except as noted in HPI  HENT: Negative for nosebleeds and neck pain.   Eyes:  Negative for photophobia and discharge.  Respiratory: Negative for cough, shortness of breath and wheezing.   Cardiovascular: Negative for chest pain and palpitations.  Gastrointestinal: Negative for abdominal pain and blood in stool.  Genitourinary: Negative for dysuria, frequency and hematuria.  Musculoskeletal: Positive for back pain. Negative for arthralgias.  Skin: Negative.   Neurological: Negative for dizziness, seizures and speech difficulty.       Paresthesia of lower extremities.  Psychiatric/Behavioral: Negative for hallucinations and confusion.       Mood disorder    Allergies  Wheat; Soybean-containing drug products; Tape; Codeine; and Compazine  Home Medications   Current Outpatient Rx  Name Route Sig Dispense Refill  . CLONAZEPAM 0.5 MG PO TABS Oral Take 0.5 mg by mouth 2 (two) times daily.      . CYCLOBENZAPRINE HCL 10 MG PO TABS Oral Take 10 mg by mouth 3 (three) times daily.    Marland Kitchen DIPHENHYDRAMINE HCL 25 MG PO TABS Oral Take 1 tablet (25 mg total) by mouth every 6 (six) hours as needed for itching (Rash). 20 tablet 0  . DULOXETINE HCL 30 MG PO CPEP Oral Take 30 mg by mouth 2 (two) times daily.    . INSULIN GLARGINE 100 UNIT/ML [place] SOLN Subcutaneous Inject 60 Units into  the skin at bedtime.     . INSULIN LISPRO (HUMAN) 100 UNIT/ML [place] SOLN Subcutaneous Inject 16-22 Units into the skin 3 (three) times daily before meals. Per sliding scale. Pt will not exceed 15 units.    Marland Kitchen METFORMIN HCL 500 MG PO TABS Oral Take 500 mg by mouth 2 (two) times daily with a meal.    . OXYCODONE-ACETAMINOPHEN 5-325 MG PO TABS Oral Take 1 tablet by mouth 2 (two) times daily.    Marland Kitchen PREGABALIN 300 MG PO CAPS Oral Take 300 mg by mouth 2 (two) times daily.      BP 145/85  Pulse 109  Temp 97.9 F (36.6 C) (Oral)  Resp 20  Ht 5\' 4"  (1.626 m)  Wt 180 lb (81.647 kg)  BMI 30.90 kg/m2  SpO2 96%  LMP 04/19/2012  Physical Exam  Nursing note and vitals reviewed. Constitutional: She is oriented to  person, place, and time. She appears well-developed and well-nourished.  Non-toxic appearance.  HENT:  Head: Normocephalic.  Right Ear: Tympanic membrane and external ear normal.  Left Ear: Tympanic membrane and external ear normal.  Eyes: EOM and lids are normal. Pupils are equal, round, and reactive to light.  Neck: Normal range of motion. Neck supple. Carotid bruit is not present.  Cardiovascular: Regular rhythm, normal heart sounds, intact distal pulses and normal pulses.  Tachycardia present.   Pulmonary/Chest: Breath sounds normal. No respiratory distress.  Abdominal: Soft. Bowel sounds are normal. There is no tenderness. There is no guarding.  Musculoskeletal: Normal range of motion.       The dorsalis pedis pulses or left and right are 2+ and symmetrical. There is full range of motion of the toes on left. There is a fine red rash at the dorsum of both feet. There is no palpable or visible bite site of the plantar surface of the left foot. There is no significant swelling of the dorsum of the left foot when compared to the right foot. There is no red streaking going up the leg. The Achilles tendon is intact on the left..  Lymphadenopathy:       Head (right side): No submandibular adenopathy present.       Head (left side): No submandibular adenopathy present.    She has no cervical adenopathy.  Neurological: She is alert and oriented to person, place, and time. She has normal strength. No cranial nerve deficit or sensory deficit.  Skin: Skin is warm and dry.  Psychiatric: She has a normal mood and affect. Her speech is normal.    ED Course  Procedures (including critical care time)  Labs Reviewed - No data to display No results found.   No diagnosis found.    MDM  I have reviewed nursing notes, vital signs, and all appropriate lab and imaging results for this patient. Patient sustained a bite or steam of the left foot on yesterday while walking [place]. It is of  note that the patient has insulin requiring diabetes mellitus and a diabetic neuropathy. The patient was treated with Benadryl and prednisone on yesterday at an emergency department. It is believed at the bite or sting may have aggravated the diabetic neuropathy. There no neurovascular compromise is appreciated at this time. Glucose measures 183 at this time. The plan at this time is to stop the Benadryl. To use Vistaril for the itching and burning sensation and to use Percocet for the pain that is going from the foot up to the hip area.  Kathie Dike, [place] 05/06/12 2226

## 2012-05-06 NOTE — ED Notes (Signed)
Patient with no complaints at this time. Respirations even and unlabored. Skin warm/dry. Discharge instructions reviewed with patient at this time. Patient given opportunity to voice concerns/ask questions. Patient discharged at this time and left Emergency Department with steady gait.   

## 2012-05-06 NOTE — ED Notes (Signed)
Pt with left foot pain that radiates up both legs, was seen at [facility] yesterday for same

## 2012-05-06 NOTE — ED Notes (Addendum)
Patient states she was bitten on top of 2nd L toe while she [place]. Washed feet to be able to see where bite was.  Small area of bruising noted.  She was seen at [facility] yesterday and given steroid injection and has been taking Benadryl all day.  Patient states pain is radiating up both legs.  Reminded patient that, as a diabetic, it is extremely important for her to wear shoes outside to protect her feet.

## 2012-05-10 NOTE — ED Provider Notes (Signed)
Medical screening examination/treatment/procedure(s) were performed by non-physician practitioner and as supervising physician I was immediately available for consultation/collaboration.  Ellysia Char, MD 05/10/12 1831 

## 2012-07-10 ENCOUNTER — Emergency Department (HOSPITAL_COMMUNITY)
Admission: EM | Admit: 2012-07-10 | Discharge: 2012-07-10 | Disposition: A | Discharge status: Home / Self Care | Payer: Medicare Other | Attending: Emergency Medicine | Admitting: Emergency Medicine

## 2012-07-10 ENCOUNTER — Encounter (HOSPITAL_COMMUNITY): Payer: Self-pay | Admitting: Emergency Medicine

## 2012-07-10 DIAGNOSIS — Z9889 Other specified postprocedural states: Secondary | ICD-10-CM | POA: Insufficient documentation

## 2012-07-10 DIAGNOSIS — Z9089 Acquired absence of other organs: Secondary | ICD-10-CM | POA: Insufficient documentation

## 2012-07-10 DIAGNOSIS — E119 Type 2 diabetes mellitus without complications: Secondary | ICD-10-CM | POA: Insufficient documentation

## 2012-07-10 DIAGNOSIS — I10 Essential (primary) hypertension: Secondary | ICD-10-CM | POA: Insufficient documentation

## 2012-07-10 DIAGNOSIS — K120 Recurrent oral aphthae: Secondary | ICD-10-CM | POA: Insufficient documentation

## 2012-07-10 DIAGNOSIS — E785 Hyperlipidemia, unspecified: Secondary | ICD-10-CM | POA: Insufficient documentation

## 2012-07-10 DIAGNOSIS — F17200 Nicotine dependence, unspecified, uncomplicated: Secondary | ICD-10-CM | POA: Insufficient documentation

## 2012-07-10 DIAGNOSIS — Z79899 Other long term (current) drug therapy: Secondary | ICD-10-CM | POA: Insufficient documentation

## 2012-07-10 DIAGNOSIS — F39 Unspecified mood [affective] disorder: Secondary | ICD-10-CM | POA: Insufficient documentation

## 2012-07-10 DIAGNOSIS — M5030 Other cervical disc degeneration, unspecified cervical region: Secondary | ICD-10-CM | POA: Insufficient documentation

## 2012-07-10 MED ORDER — LIDOCAINE VISCOUS 2 % MT SOLN
20.0000 mL | Freq: Once | OROMUCOSAL | Status: AC
  Administered 2012-07-10: 15 mL via OROMUCOSAL

## 2012-07-10 MED ORDER — LIDOCAINE VISCOUS 2 % MT SOLN
OROMUCOSAL | Status: AC
  Administered 2012-07-10: 15 mL via OROMUCOSAL
  Filled 2012-07-10: qty 15

## 2012-07-10 MED ORDER — HYDROCODONE-ACETAMINOPHEN 5-325 MG PO TABS
1.0000 | ORAL_TABLET | ORAL | Status: DC | PRN
Start: 2012-07-10 — End: 2012-10-24

## 2012-07-10 NOTE — ED Provider Notes (Signed)
Medical screening examination/treatment/procedure(s) were performed by non-physician practitioner and as supervising physician I was immediately available for consultation/collaboration.   Waver Dibiasio L Aylssa Herrig, MD 07/10/12 2322 

## 2012-07-10 NOTE — ED Notes (Signed)
Pharynx slightly erythematous.  Ulcerations noted on hard palate, just beyond denture line. Denies irritation by dentures.  No noticeable lymph node swelling.  No fevers, nuchal rigidity.  Nausea and occasional vomiting x 2 days.

## 2012-07-10 NOTE — ED Notes (Signed)
Patient with no complaints at this time. Respirations even and unlabored. Skin warm/dry. Discharge instructions reviewed with patient at this time. Patient given opportunity to voice concerns/ask questions. Patient discharged at this time and left Emergency Department with steady gait.   

## 2012-07-10 NOTE — ED Provider Notes (Signed)
History     CSN: [id number]  Arrival date & time 07/10/12  2106   First MD Initiated Contact with Patient 07/10/12 2205      Chief Complaint  Patient presents with  . Sore Throat    (Consider location/radiation/quality/duration/timing/severity/associated sxs/prior treatment) HPI Comments: Patient presents to the emergency department with a four-day history of sores on the throat back of the throat and the tongue area. The patient states that she has noticed more of the lesions on her tongue. She has not had any new changes in foods, mouthwash, toothpaste, or drink. She recently had an abscess and was placed on Septra however she has finished this medication and is no longer on it. The patient is unsure of any high fever. There no lesions on any other portions of the body. The patient is concerned because of problems with diabetes.  The history is provided by the patient.    Past Medical History  Diagnosis Date  . Disc disorder of cervical region   . Hypertension   . High cholesterol   . Mood disorder   . Diabetes mellitus     Past Surgical History  Procedure Date  . Cholecystectomy   . Tubal ligation     History reviewed. No pertinent family history.  History  Substance Use Topics  . Smoking status: Current Every Day Smoker    Types: Cigarettes  . Smokeless tobacco: Not on file  . Alcohol Use: No    OB History    Grav Para Term Preterm Abortions TAB SAB Ect Mult Living                  Review of Systems  Constitutional: Negative for activity change.       All ROS Neg except as noted in HPI  HENT: Negative for nosebleeds and neck pain.   Eyes: Negative for photophobia and discharge.  Respiratory: Negative for cough, shortness of breath and wheezing.   Cardiovascular: Negative for chest pain and palpitations.  Gastrointestinal: Negative for abdominal pain and blood in stool.  Genitourinary: Negative for dysuria, frequency and hematuria.  Musculoskeletal:  Positive for back pain. Negative for arthralgias.  Skin: Negative.   Neurological: Negative for dizziness, seizures and speech difficulty.  Psychiatric/Behavioral: Negative for hallucinations and confusion.       Mood disorder    Allergies  Wheat; Soybean-containing drug products; Tape; Codeine; and Compazine  Home Medications   Current Outpatient Rx  Name Route Sig Dispense Refill  . CLONAZEPAM 0.5 MG PO TABS Oral Take 0.5 mg by mouth 2 (two) times daily.      . CYCLOBENZAPRINE HCL 10 MG PO TABS Oral Take 10 mg by mouth 3 (three) times daily.    Marland Kitchen DIPHENHYDRAMINE HCL 25 MG PO TABS Oral Take 1 tablet (25 mg total) by mouth every 6 (six) hours as needed for itching (Rash). 20 tablet 0  . DULOXETINE HCL 30 MG PO CPEP Oral Take 30 mg by mouth 2 (two) times daily.    Marland Kitchen HYDROCODONE-ACETAMINOPHEN 5-325 MG PO TABS Oral Take 1 tablet by mouth every 4 (four) hours as needed for pain. 15 tablet 0  . HYDROXYZINE PAMOATE 25 MG PO CAPS  1 po q6h prn itching/burning 24 capsule 0  . INSULIN GLARGINE 100 UNIT/ML [place] SOLN Subcutaneous Inject 60 Units into the skin at bedtime.     . INSULIN LISPRO (HUMAN) 100 UNIT/ML [place] SOLN Subcutaneous Inject 16-22 Units into the skin 3 (three) times daily before meals. Per  sliding scale. Pt will not exceed 15 units.    Marland Kitchen METFORMIN HCL 500 MG PO TABS Oral Take 500 mg by mouth 2 (two) times daily with a meal.    . OXYCODONE-ACETAMINOPHEN 5-325 MG PO TABS Oral Take 1 tablet by mouth 2 (two) times daily.    Marland Kitchen PREGABALIN 300 MG PO CAPS Oral Take 300 mg by mouth 2 (two) times daily.      BP 130/88  Pulse 86  Temp 97.6 F (36.4 C) (Oral)  Resp 18  Ht 5\' 4"  (1.626 m)  Wt 180 lb (81.647 kg)  BMI 30.90 kg/m2  SpO2 95%  LMP 07/10/2012  Physical Exam  Nursing note and vitals reviewed. Constitutional: She is oriented to person, place, and time. She appears well-developed and well-nourished.  Non-toxic appearance.  HENT:  Head: Normocephalic.  Right Ear: Tympanic  membrane and external ear normal.  Left Ear: Tympanic membrane and external ear normal.       Small ulcers with white borders on the tongue and 2 in the posterior pharynx. The airway is patent. The uvula is in the midline.  Eyes: EOM and lids are normal. Pupils are equal, round, and reactive to light.  Neck: Normal range of motion. Neck supple. Carotid bruit is not present. No tracheal deviation present.  Cardiovascular: Normal rate, regular rhythm, normal heart sounds, intact distal pulses and normal pulses.   Pulmonary/Chest: Breath sounds normal. No respiratory distress.  Abdominal: Soft. Bowel sounds are normal. There is no tenderness. There is no guarding.  Musculoskeletal: Normal range of motion.  Lymphadenopathy:       Head (right side): No submandibular adenopathy present.       Head (left side): No submandibular adenopathy present.    She has no cervical adenopathy.  Neurological: She is alert and oriented to person, place, and time. She has normal strength. No cranial nerve deficit or sensory deficit.  Skin: Skin is warm and dry.  Psychiatric: She has a normal mood and affect. Her speech is normal.    ED Course  Procedures (including critical care time)  Labs Reviewed - No data to display No results found.   1. Aphthous ulcer of mouth       MDM  I have reviewed nursing notes, vital signs, and all appropriate lab and imaging results for this patient. Patient has a stomatitis of the oral pharynx. Patient treated with viscous lidocaine here in the emergency department. Patient advised to use warm saltwater swish and gargle 2-3 times daily, Chloraseptic gargles, and prescription for Norco given to the patient. Patient to see her primary physician next week for recheck.       Kathie Dike, [place] 07/10/12 2249

## 2012-07-10 NOTE — ED Notes (Signed)
Patient complaining of sore throat and "sores on my tongue" x 4 days.

## 2012-10-24 ENCOUNTER — Emergency Department (HOSPITAL_COMMUNITY): Payer: Medicare Other

## 2012-10-24 ENCOUNTER — Encounter (HOSPITAL_COMMUNITY): Payer: Self-pay

## 2012-10-24 ENCOUNTER — Emergency Department (HOSPITAL_COMMUNITY)
Admission: EM | Admit: 2012-10-24 | Discharge: 2012-10-24 | Disposition: A | Discharge status: Home / Self Care | Payer: Medicare Other | Attending: Emergency Medicine | Admitting: Emergency Medicine

## 2012-10-24 DIAGNOSIS — I10 Essential (primary) hypertension: Secondary | ICD-10-CM | POA: Insufficient documentation

## 2012-10-24 DIAGNOSIS — Z8639 Personal history of other endocrine, nutritional and metabolic disease: Secondary | ICD-10-CM | POA: Insufficient documentation

## 2012-10-24 DIAGNOSIS — J9801 Acute bronchospasm: Secondary | ICD-10-CM | POA: Insufficient documentation

## 2012-10-24 DIAGNOSIS — J40 Bronchitis, not specified as acute or chronic: Secondary | ICD-10-CM | POA: Insufficient documentation

## 2012-10-24 DIAGNOSIS — Z862 Personal history of diseases of the blood and blood-forming organs and certain disorders involving the immune mechanism: Secondary | ICD-10-CM | POA: Insufficient documentation

## 2012-10-24 DIAGNOSIS — F17200 Nicotine dependence, unspecified, uncomplicated: Secondary | ICD-10-CM | POA: Insufficient documentation

## 2012-10-24 DIAGNOSIS — Z79899 Other long term (current) drug therapy: Secondary | ICD-10-CM | POA: Insufficient documentation

## 2012-10-24 DIAGNOSIS — Z8739 Personal history of other diseases of the musculoskeletal system and connective tissue: Secondary | ICD-10-CM | POA: Insufficient documentation

## 2012-10-24 DIAGNOSIS — E119 Type 2 diabetes mellitus without complications: Secondary | ICD-10-CM | POA: Insufficient documentation

## 2012-10-24 DIAGNOSIS — R05 Cough: Secondary | ICD-10-CM | POA: Insufficient documentation

## 2012-10-24 DIAGNOSIS — R059 Cough, unspecified: Secondary | ICD-10-CM | POA: Insufficient documentation

## 2012-10-24 DIAGNOSIS — J3489 Other specified disorders of nose and nasal sinuses: Secondary | ICD-10-CM | POA: Insufficient documentation

## 2012-10-24 DIAGNOSIS — Z8659 Personal history of other mental and behavioral disorders: Secondary | ICD-10-CM | POA: Insufficient documentation

## 2012-10-24 DIAGNOSIS — Z794 Long term (current) use of insulin: Secondary | ICD-10-CM | POA: Insufficient documentation

## 2012-10-24 LAB — CBC WITH DIFFERENTIAL/PLATELET
Hemoglobin: 16.1 g/dL — ABNORMAL HIGH (ref 12.0–15.0)
Lymphocytes Relative: 29 % (ref 12–46)
Lymphs Abs: 3.3 10*3/uL (ref 0.7–4.0)
Neutro Abs: 6.9 10*3/uL (ref 1.7–7.7)
Neutrophils Relative %: 63 % (ref 43–77)
Platelets: 195 10*3/uL (ref 150–400)
RBC: 4.92 MIL/uL (ref 3.87–5.11)
WBC: 11.1 10*3/uL — ABNORMAL HIGH (ref 4.0–10.5)

## 2012-10-24 LAB — BASIC METABOLIC PANEL
CO2: 23 mEq/L (ref 19–32)
Calcium: 9.3 mg/dL (ref 8.4–10.5)
Chloride: 97 mEq/L (ref 96–112)
GFR calc Af Amer: >90 mL/min (ref >90)
Glucose, Bld: 321 mg/dL — ABNORMAL HIGH (ref 70–99)
Potassium: 3.6 mEq/L (ref 3.5–5.1)
Sodium: 133 mEq/L — ABNORMAL LOW (ref 135–145)

## 2012-10-24 LAB — TROPONIN I: Troponin I: <0.3 ng/mL (ref <0.30)

## 2012-10-24 MED ORDER — LORAZEPAM 2 MG/ML IJ SOLN
0.5000 mg | Freq: Once | INTRAMUSCULAR | Status: AC
  Administered 2012-10-24: 0.5 mg via INTRAVENOUS
  Filled 2012-10-24: qty 1

## 2012-10-24 MED ORDER — ALBUTEROL SULFATE (5 MG/ML) 0.5% IN NEBU
2.5000 mg | INHALATION_SOLUTION | Freq: Once | RESPIRATORY_TRACT | Status: AC
  Administered 2012-10-24: 2.5 mg via RESPIRATORY_TRACT
  Filled 2012-10-24: qty 0.5

## 2012-10-24 MED ORDER — ALBUTEROL SULFATE (2.5 MG/3ML) 0.083% IN NEBU: 2.5000 mg | INHALATION_SOLUTION | Freq: Four times a day (QID) | RESPIRATORY_TRACT | Status: DC | PRN

## 2012-10-24 MED ORDER — HYDROMORPHONE HCL PF 1 MG/ML IJ SOLN
1.0000 mg | Freq: Once | INTRAMUSCULAR | Status: AC
  Administered 2012-10-24: 1 mg via INTRAVENOUS
  Filled 2012-10-24: qty 1

## 2012-10-24 MED ORDER — AMOXICILLIN 500 MG PO CAPS: 500.0000 mg | ORAL_CAPSULE | Freq: Three times a day (TID) | ORAL | Status: DC

## 2012-10-24 NOTE — ED Notes (Signed)
Pt sick with cough, congestion for 4 days, today worse, hurts to take deep breath unable to lay flat and feels llike when she had pneumonia in the past. Fever at times. Green sputum.

## 2012-10-24 NOTE — ED Notes (Signed)
o2 sat on room air noted to be 88%, placed on 2 lpm O2 with improvement in O2 sat to 96%.

## 2012-10-24 NOTE — ED Provider Notes (Signed)
History     CSN: [id number]  Arrival date & time 10/24/12  1408   First MD Initiated Contact with Patient 10/24/12 1420      Chief Complaint  Patient presents with  . Shortness of Breath  . Cough  . Nasal Congestion    (Consider location/radiation/quality/duration/timing/severity/associated sxs/prior treatment) Patient is a 42 y.o. female presenting with shortness of breath and cough. The history is provided by the patient (the pt complains of sob and cough).  Shortness of Breath Onset quality:  Sudden Timing:  Constant Progression:  Worsening Chronicity:  Recurrent Context: URI   Associated symptoms: cough   Associated symptoms: no abdominal pain, no chest pain, no headaches and no rash   Cough Associated symptoms: shortness of breath   Associated symptoms: no chest pain, no eye discharge, no headaches and no rash     Past Medical History  Diagnosis Date  . Disc disorder of cervical region   . Hypertension   . High cholesterol   . Mood disorder   . Diabetes mellitus     Past Surgical History  Procedure Laterality Date  . Cholecystectomy    . Tubal ligation      No family history on file.  History  Substance Use Topics  . Smoking status: Current Every Day Smoker    Types: Cigarettes  . Smokeless tobacco: Not on file  . Alcohol Use: No    OB History   Grav Para Term Preterm Abortions TAB SAB Ect Mult Living                  Review of Systems  Constitutional: Negative for fatigue.  HENT: Negative for congestion, sinus pressure and ear discharge.   Eyes: Negative for discharge.  Respiratory: Positive for cough and shortness of breath.   Cardiovascular: Negative for chest pain.  Gastrointestinal: Negative for abdominal pain and diarrhea.  Genitourinary: Negative for frequency and hematuria.  Musculoskeletal: Negative for back pain.  Skin: Negative for rash.  Neurological: Negative for seizures and headaches.  Psychiatric/Behavioral: Negative for  hallucinations.    Allergies  Wheat; Soybean-containing drug products; Tape; Codeine; and Compazine  Home Medications   Current Outpatient Rx  Name  Route  Sig  Dispense  Refill  . cyclobenzaprine (FLEXERIL) 10 MG tablet   Oral   Take 10 mg by mouth 3 (three) times daily.         . insulin glargine (LANTUS) 100 UNIT/ML injection   Subcutaneous   Inject 60 Units into the skin at bedtime.          . insulin lispro (HUMALOG) 100 UNIT/ML injection   Subcutaneous   Inject 16-22 Units into the skin 3 (three) times daily before meals. Per sliding scale. Pt will not exceed 15 units.         . metFORMIN (GLUCOPHAGE) 500 MG tablet   Oral   Take 500 mg by mouth 2 (two) times daily with a meal.         . oxyCODONE-acetaminophen (PERCOCET/ROXICET) 5-325 MG per tablet   Oral   Take 1 tablet by mouth 2 (two) times daily.         . pregabalin (LYRICA) 300 MG capsule   Oral   Take 300 mg by mouth 2 (two) times daily.         Marland Kitchen albuterol (PROVENTIL) (2.5 MG/3ML) 0.083% nebulizer solution   Nebulization   Take 3 mLs (2.5 mg total) by nebulization every 6 (six) hours as needed for  wheezing.   75 mL   12   . amoxicillin (AMOXIL) 500 MG capsule   Oral   Take 1 capsule (500 mg total) by mouth 3 (three) times daily.   21 capsule   0   . clonazePAM (KLONOPIN) 0.5 MG tablet   Oral   Take 0.5 mg by mouth 2 (two) times daily.           Marland Kitchen EXPIRED: diphenhydrAMINE (BENADRYL) 25 MG tablet   Oral   Take 1 tablet (25 mg total) by mouth every 6 (six) hours as needed for itching (Rash).   20 tablet   0     BP 131/93  Pulse 95  Temp(Src) 97.7 F (36.5 C) (Oral)  Resp 23  Ht 5\' 4"  (1.626 m)  SpO2 96%  LMP 10/14/2012  Physical Exam  Constitutional: She is oriented to person, place, and time. She appears well-developed.  HENT:  Head: Normocephalic and atraumatic.  Eyes: Conjunctivae and EOM are normal. No scleral icterus.  Neck: Neck supple. No thyromegaly present.   Cardiovascular: Normal rate and regular rhythm.  Exam reveals no gallop and no friction rub.   No murmur heard. Pulmonary/Chest: No stridor. She has wheezes. She has no rales. She exhibits no tenderness.  Abdominal: She exhibits no distension. There is no tenderness. There is no rebound.  Musculoskeletal: Normal range of motion. She exhibits no edema.  Lymphadenopathy:    She has no cervical adenopathy.  Neurological: She is oriented to person, place, and time. Coordination normal.  Skin: No rash noted. No erythema.  Psychiatric: She has a normal mood and affect. Her behavior is normal.    ED Course  Procedures (including critical care time)  Labs Reviewed  CBC WITH DIFFERENTIAL - Abnormal; Notable for the following:    WBC 11.1 (*)    Hemoglobin 16.1 (*)    All other components within normal limits  BASIC METABOLIC PANEL - Abnormal; Notable for the following:    Sodium 133 (*)    Glucose, Bld 321 (*)    Creatinine, Ser 0.39 (*)    All other components within normal limits  GLUCOSE, CAPILLARY - Abnormal; Notable for the following:    Glucose-Capillary 326 (*)    All other components within normal limits  TROPONIN I   Dg Chest 2 View  10/24/2012  *RADIOLOGY REPORT*  Clinical Data: Shortness of breath, cough, congestion  CHEST - 2 VIEW  Comparison: Chest x-ray of 07/28/2011  Findings: No active infiltrate or effusion is seen.  There is peribronchial thickening which may indicate bronchitis, possibly chronic.  The heart is within normal limits in size.  No skeletal abnormality is seen.  IMPRESSION: No pneumonia.  Probable chronic bronchitis.   Original Report Authenticated By: Dwyane Dee, M.D.      1. Bronchitis   2. Bronchospasm       MDM          [name], MD 10/24/12 [phone]

## 2012-11-06 ENCOUNTER — Encounter (HOSPITAL_COMMUNITY): Payer: Self-pay | Admitting: *Deleted

## 2012-11-06 ENCOUNTER — Emergency Department (HOSPITAL_COMMUNITY): Payer: Medicare Other

## 2012-11-06 ENCOUNTER — Emergency Department (HOSPITAL_COMMUNITY)
Admission: EM | Admit: 2012-11-06 | Discharge: 2012-11-06 | Disposition: A | Discharge status: Home / Self Care | Payer: Medicare Other | Attending: Emergency Medicine | Admitting: Emergency Medicine

## 2012-11-06 ENCOUNTER — Other Ambulatory Visit: Payer: Self-pay

## 2012-11-06 DIAGNOSIS — I10 Essential (primary) hypertension: Secondary | ICD-10-CM | POA: Insufficient documentation

## 2012-11-06 DIAGNOSIS — F39 Unspecified mood [affective] disorder: Secondary | ICD-10-CM | POA: Insufficient documentation

## 2012-11-06 DIAGNOSIS — E119 Type 2 diabetes mellitus without complications: Secondary | ICD-10-CM | POA: Insufficient documentation

## 2012-11-06 DIAGNOSIS — Z79899 Other long term (current) drug therapy: Secondary | ICD-10-CM | POA: Insufficient documentation

## 2012-11-06 DIAGNOSIS — F17200 Nicotine dependence, unspecified, uncomplicated: Secondary | ICD-10-CM | POA: Insufficient documentation

## 2012-11-06 DIAGNOSIS — R062 Wheezing: Secondary | ICD-10-CM | POA: Insufficient documentation

## 2012-11-06 DIAGNOSIS — Z794 Long term (current) use of insulin: Secondary | ICD-10-CM | POA: Insufficient documentation

## 2012-11-06 DIAGNOSIS — Z8739 Personal history of other diseases of the musculoskeletal system and connective tissue: Secondary | ICD-10-CM | POA: Insufficient documentation

## 2012-11-06 DIAGNOSIS — M25519 Pain in unspecified shoulder: Secondary | ICD-10-CM | POA: Insufficient documentation

## 2012-11-06 DIAGNOSIS — E7800 Pure hypercholesterolemia, unspecified: Secondary | ICD-10-CM | POA: Insufficient documentation

## 2012-11-06 DIAGNOSIS — R079 Chest pain, unspecified: Secondary | ICD-10-CM | POA: Insufficient documentation

## 2012-11-06 DIAGNOSIS — R059 Cough, unspecified: Secondary | ICD-10-CM | POA: Insufficient documentation

## 2012-11-06 DIAGNOSIS — M549 Dorsalgia, unspecified: Secondary | ICD-10-CM | POA: Insufficient documentation

## 2012-11-06 DIAGNOSIS — J9801 Acute bronchospasm: Secondary | ICD-10-CM | POA: Insufficient documentation

## 2012-11-06 DIAGNOSIS — Z792 Long term (current) use of antibiotics: Secondary | ICD-10-CM | POA: Insufficient documentation

## 2012-11-06 MED ORDER — METHYLPREDNISOLONE SODIUM SUCC 125 MG IJ SOLR
125.0000 mg | Freq: Once | INTRAMUSCULAR | Status: AC
  Administered 2012-11-06: 125 mg via INTRAMUSCULAR
  Filled 2012-11-06: qty 2

## 2012-11-06 MED ORDER — SULFAMETHOXAZOLE-TRIMETHOPRIM 800-160 MG PO TABS: 1.0000 | ORAL_TABLET | Freq: Two times a day (BID) | ORAL | Status: DC

## 2012-11-06 MED ORDER — ALBUTEROL SULFATE (5 MG/ML) 0.5% IN NEBU
2.5000 mg | INHALATION_SOLUTION | Freq: Once | RESPIRATORY_TRACT | Status: AC
  Administered 2012-11-06: 2.5 mg via RESPIRATORY_TRACT
  Filled 2012-11-06: qty 0.5

## 2012-11-06 MED ORDER — IPRATROPIUM BROMIDE 0.02 % IN SOLN
0.5000 mg | Freq: Once | RESPIRATORY_TRACT | Status: AC
  Administered 2012-11-06: 0.5 mg via RESPIRATORY_TRACT
  Filled 2012-11-06: qty 2.5

## 2012-11-06 MED ORDER — ALBUTEROL SULFATE HFA 108 (90 BASE) MCG/ACT IN AERS
2.0000 | INHALATION_SPRAY | RESPIRATORY_TRACT | Status: DC | PRN
  Administered 2012-11-06: 2 via RESPIRATORY_TRACT
  Filled 2012-11-06: qty 6.7

## 2012-11-06 NOTE — ED Notes (Signed)
Pt c/o chest congestion and pain along with productive cough, nasal drainage and difficulty breathing. Pt was seen here 10-12 days for same symptoms and given abx for home. Pt denies any relief of symptoms.

## 2012-11-06 NOTE — ED Notes (Signed)
Pt c/o chest pain radiating to left shoulder, and sob x 2 weeks.

## 2012-11-06 NOTE — ED Provider Notes (Signed)
History    This chart was scribed for Jaclyn Lennert, MD by Jaclyn Cruz, ED Scribe. The patient was seen in room APA14/APA14 and the patient's care was started at 7:55PM.    CSN: [id number]  Arrival date & time 11/06/12  1908   First MD Initiated Contact with Patient 11/06/12 1948      Chief Complaint  Patient presents with  . Chest Pain  . Shoulder Pain  . Shortness of Breath    (Consider location/radiation/quality/duration/timing/severity/associated sxs/prior treatment) Patient is a 42 y.o. female presenting with chest pain, shoulder pain, and shortness of breath. The history is provided by the patient. No language interpreter was used.  Chest Pain Pain quality: stabbing   Pain radiates to:  L shoulder Pain radiates to the back: yes   Pain severity:  Moderate Onset quality:  Gradual Duration:  2 weeks Timing:  Constant Progression:  Worsening Chronicity:  New Context: breathing   Associated symptoms: back pain and shortness of breath   Associated symptoms: no diaphoresis, no fever, no nausea and not vomiting   Shoulder Pain Associated symptoms include chest pain and shortness of breath.  Shortness of Breath Associated symptoms: chest pain and wheezing   Associated symptoms: no diaphoresis, no fever and no vomiting    Jaclyn Cruz is a 42 y.o. female who presents to the Emergency Department complaining of constant, moderate to severe, stabbing, chest pain that radiates to her left shoulder and back with associated shortness of breath with wheezing and productive cough with chest congestion with an onset 2 weeks ago. She has a chronic history of respiratory problems due to her history of smoking (she reports she smokes over a pack a day). She reports that deep breathing aggravates the chest pain. Nebulizer alleviated her wheezing but she has not refilled it due to financial reasons. Allergic to codeine and compazine. No other pertinent medical symptoms.  PCP:  Jaclyn Cruz  Past Medical History  Diagnosis Date  . Disc disorder of cervical region   . Hypertension   . High cholesterol   . Mood disorder   . Diabetes mellitus     Past Surgical History  Procedure Laterality Date  . Cholecystectomy    . Tubal ligation      History reviewed. No pertinent family history.  History  Substance Use Topics  . Smoking status: Current Every Day Smoker    Types: Cigarettes  . Smokeless tobacco: Not on file  . Alcohol Use: No    OB History   Grav Para Term Preterm Abortions TAB SAB Ect Mult Living                  Review of Systems  Constitutional: Negative for fever and diaphoresis.  Respiratory: Positive for shortness of breath and wheezing.   Cardiovascular: Positive for chest pain.  Gastrointestinal: Negative for nausea, vomiting and diarrhea.  Musculoskeletal: Positive for back pain and arthralgias.  All other systems reviewed and are negative.      Allergies  Wheat; Soybean-containing drug products; Tape; Codeine; and Compazine  Home Medications   Current Outpatient Rx  Name  Route  Sig  Dispense  Refill  . albuterol (PROVENTIL) (2.5 MG/3ML) 0.083% nebulizer solution   Nebulization   Take 3 mLs (2.5 mg total) by nebulization every 6 (six) hours as needed for wheezing.   75 mL   12   . amoxicillin (AMOXIL) 500 MG capsule   Oral   Take 1 capsule (500 mg total) by mouth  3 (three) times daily.   21 capsule   0   . clonazePAM (KLONOPIN) 0.5 MG tablet   Oral   Take 0.5 mg by mouth 2 (two) times daily.           . cyclobenzaprine (FLEXERIL) 10 MG tablet   Oral   Take 10 mg by mouth 3 (three) times daily.         Marland Kitchen EXPIRED: diphenhydrAMINE (BENADRYL) 25 MG tablet   Oral   Take 1 tablet (25 mg total) by mouth every 6 (six) hours as needed for itching (Rash).   20 tablet   0   . insulin glargine (LANTUS) 100 UNIT/ML injection   Subcutaneous   Inject 60 Units into the skin at bedtime.          . insulin  lispro (HUMALOG) 100 UNIT/ML injection   Subcutaneous   Inject 16-22 Units into the skin 3 (three) times daily before meals. Per sliding scale. Pt will not exceed 15 units.         . metFORMIN (GLUCOPHAGE) 500 MG tablet   Oral   Take 500 mg by mouth 2 (two) times daily with a meal.         . oxyCODONE-acetaminophen (PERCOCET/ROXICET) 5-325 MG per tablet   Oral   Take 1 tablet by mouth 2 (two) times daily.         . pregabalin (LYRICA) 300 MG capsule   Oral   Take 300 mg by mouth 2 (two) times daily.           BP 137/111  Pulse 109  Temp(Src) 97.5 F (36.4 C) (Oral)  Resp 20  Ht 5\' 4"  (1.626 m)  Wt 220 lb (99.791 kg)  BMI 37.74 kg/m2  SpO2 98%  LMP 10/27/2012  Physical Exam  Nursing note and vitals reviewed. Constitutional: She is oriented to person, place, and time. She appears well-developed.  HENT:  Head: Normocephalic and atraumatic.  Eyes: Conjunctivae and EOM are normal. No scleral icterus.  Neck: Neck supple. No thyromegaly present.  Cardiovascular: Normal rate and regular rhythm.  Exam reveals no gallop and no friction rub.   No murmur heard. Pulmonary/Chest: No stridor. She has wheezes. She has no rales. She exhibits no tenderness.  Minimal wheezing bilaterally  Abdominal: She exhibits no distension. There is no tenderness. There is no rebound.  Musculoskeletal: Normal range of motion. She exhibits no edema.  Lymphadenopathy:    She has no cervical adenopathy.  Neurological: She is oriented to person, place, and time. Coordination normal.  Skin: No rash noted. No erythema.  Psychiatric: She has a normal mood and affect. Her behavior is normal.    ED Course  Procedures (including critical care time)  DIAGNOSTIC STUDIES: Oxygen Saturation is 98% on room air, normal by my interpretation.    COORDINATION OF CARE:  8:00PM - CXR results reviewed and are unremarkable. EKG and breathing treatment will be ordered for Jaclyn Cruz.     Date:  11/06/2012  Rate: 106  Rhythm: sinus tachycardia  QRS Axis: normal  Intervals: normal  ST/T Wave abnormalities: normal  Conduction Disutrbances:none  Narrative Interpretation:   Old EKG Reviewed: unchanged   Labs Reviewed - No data to display Dg Chest 2 View  11/06/2012  *RADIOLOGY REPORT*  Clinical Data: Short of breath.  Cough.  CHEST - 2 VIEW  Comparison: 10/24/2012  Findings: Heart size is normal.  Mediastinal shadows are normal. The lungs are clear.  No effusions.  No  bony abnormalities.  IMPRESSION: Normal chest   Original Report Authenticated By: Paulina Fusi, M.D.      No diagnosis found.  Pt improved with tx  MDM  The chart was scribed for me under my direct supervision.  I personally performed the history, physical, and medical decision making and all procedures in the evaluation of this patient.Jaclyn Lennert, MD 11/06/12 2049

## 2012-11-07 ENCOUNTER — Emergency Department (HOSPITAL_COMMUNITY): Payer: Medicare Other

## 2012-11-07 ENCOUNTER — Encounter (HOSPITAL_COMMUNITY): Payer: Self-pay | Admitting: Emergency Medicine

## 2012-11-07 ENCOUNTER — Emergency Department (HOSPITAL_COMMUNITY)
Admission: EM | Admit: 2012-11-07 | Discharge: 2012-11-07 | Disposition: A | Discharge status: Home / Self Care | Payer: Medicare Other | Attending: Emergency Medicine | Admitting: Emergency Medicine

## 2012-11-07 DIAGNOSIS — Z79899 Other long term (current) drug therapy: Secondary | ICD-10-CM | POA: Insufficient documentation

## 2012-11-07 DIAGNOSIS — M545 Low back pain, unspecified: Secondary | ICD-10-CM | POA: Insufficient documentation

## 2012-11-07 DIAGNOSIS — Z794 Long term (current) use of insulin: Secondary | ICD-10-CM | POA: Insufficient documentation

## 2012-11-07 DIAGNOSIS — Z8739 Personal history of other diseases of the musculoskeletal system and connective tissue: Secondary | ICD-10-CM | POA: Insufficient documentation

## 2012-11-07 DIAGNOSIS — Z8659 Personal history of other mental and behavioral disorders: Secondary | ICD-10-CM | POA: Insufficient documentation

## 2012-11-07 DIAGNOSIS — Z8639 Personal history of other endocrine, nutritional and metabolic disease: Secondary | ICD-10-CM | POA: Insufficient documentation

## 2012-11-07 DIAGNOSIS — E119 Type 2 diabetes mellitus without complications: Secondary | ICD-10-CM | POA: Insufficient documentation

## 2012-11-07 DIAGNOSIS — I10 Essential (primary) hypertension: Secondary | ICD-10-CM | POA: Insufficient documentation

## 2012-11-07 DIAGNOSIS — Z862 Personal history of diseases of the blood and blood-forming organs and certain disorders involving the immune mechanism: Secondary | ICD-10-CM | POA: Insufficient documentation

## 2012-11-07 DIAGNOSIS — M25559 Pain in unspecified hip: Secondary | ICD-10-CM | POA: Insufficient documentation

## 2012-11-07 DIAGNOSIS — F17200 Nicotine dependence, unspecified, uncomplicated: Secondary | ICD-10-CM | POA: Insufficient documentation

## 2012-11-07 DIAGNOSIS — R51 Headache: Secondary | ICD-10-CM | POA: Insufficient documentation

## 2012-11-07 LAB — URINALYSIS, ROUTINE W REFLEX MICROSCOPIC
Bilirubin Urine: NEGATIVE
Glucose, UA: >1000 mg/dL — AB
Hgb urine dipstick: NEGATIVE
Protein, ur: NEGATIVE mg/dL
Urobilinogen, UA: 0.2 mg/dL (ref 0.0–1.0)

## 2012-11-07 MED ORDER — OXYCODONE-ACETAMINOPHEN 5-325 MG PO TABS
2.0000 | ORAL_TABLET | Freq: Once | ORAL | Status: AC
  Administered 2012-11-07: 2 via ORAL
  Filled 2012-11-07: qty 2

## 2012-11-07 MED ORDER — OXYCODONE-ACETAMINOPHEN 5-325 MG PO TABS: 2.0000 | ORAL_TABLET | ORAL | Status: DC | PRN

## 2012-11-07 MED ORDER — IBUPROFEN 800 MG PO TABS: 800.0000 mg | ORAL_TABLET | Freq: Three times a day (TID) | ORAL | Status: DC

## 2012-11-07 NOTE — ED Notes (Signed)
Pt c/o lower back pain since 7am this morning. Pt denies injury.

## 2012-11-07 NOTE — ED Provider Notes (Signed)
Medical screening examination/treatment/procedure(s) were performed by non-physician practitioner and as supervising physician I was immediately available for consultation/collaboration.   [name], MD 11/07/12 2241
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
## 2012-11-07 NOTE — ED Notes (Addendum)
Patient seen here yesterday and given "steriod" IM injection here last night. Per patient pain and swelling at site. Pain in right hip radiating to left hip. Patient used ice pack and ibuprofen 800mg  with no relief. Patient c/o headache since yesterday. Patien requesting a referral to psychiatrist in [place], had one in psychiatrist in [place] but was unable to make appointment.

## 2012-11-26 ENCOUNTER — Other Ambulatory Visit: Payer: Self-pay | Admitting: Family Medicine

## 2012-12-02 ENCOUNTER — Other Ambulatory Visit: Payer: Medicare Other

## 2012-12-03 ENCOUNTER — Inpatient Hospital Stay: Admission: RE | Admit: 2012-12-03 | Payer: Medicare Other | Source: Ambulatory Visit

## 2013-02-27 ENCOUNTER — Encounter (HOSPITAL_COMMUNITY): Payer: Self-pay | Admitting: Emergency Medicine

## 2013-02-27 DIAGNOSIS — Z794 Long term (current) use of insulin: Secondary | ICD-10-CM | POA: Insufficient documentation

## 2013-02-27 DIAGNOSIS — Z79899 Other long term (current) drug therapy: Secondary | ICD-10-CM | POA: Insufficient documentation

## 2013-02-27 DIAGNOSIS — E7800 Pure hypercholesterolemia, unspecified: Secondary | ICD-10-CM | POA: Insufficient documentation

## 2013-02-27 DIAGNOSIS — L02219 Cutaneous abscess of trunk, unspecified: Secondary | ICD-10-CM | POA: Insufficient documentation

## 2013-02-27 DIAGNOSIS — F17200 Nicotine dependence, unspecified, uncomplicated: Secondary | ICD-10-CM | POA: Insufficient documentation

## 2013-02-27 DIAGNOSIS — L03319 Cellulitis of trunk, unspecified: Secondary | ICD-10-CM | POA: Insufficient documentation

## 2013-02-27 DIAGNOSIS — E119 Type 2 diabetes mellitus without complications: Secondary | ICD-10-CM | POA: Insufficient documentation

## 2013-02-27 DIAGNOSIS — I10 Essential (primary) hypertension: Secondary | ICD-10-CM | POA: Insufficient documentation

## 2013-02-27 NOTE — ED Notes (Signed)
Patient complaining of abscess under left arm x 7 days.

## 2013-02-28 ENCOUNTER — Emergency Department (HOSPITAL_COMMUNITY)
Admission: EM | Admit: 2013-02-28 | Discharge: 2013-02-28 | Disposition: A | Discharge status: Home / Self Care | Payer: Medicare Other | Attending: Emergency Medicine | Admitting: Emergency Medicine

## 2013-02-28 DIAGNOSIS — L0291 Cutaneous abscess, unspecified: Secondary | ICD-10-CM

## 2013-02-28 MED ORDER — LIDOCAINE HCL (PF) 2 % IJ SOLN
10.0000 mL | Freq: Once | INTRAMUSCULAR | Status: AC
  Administered 2013-02-28: 10 mL
  Filled 2013-02-28: qty 10

## 2013-02-28 MED ORDER — SULFAMETHOXAZOLE-TRIMETHOPRIM 800-160 MG PO TABS: 1.0000 | ORAL_TABLET | Freq: Two times a day (BID) | ORAL | Status: DC

## 2013-02-28 MED ORDER — SULFAMETHOXAZOLE-TMP DS 800-160 MG PO TABS
1.0000 | ORAL_TABLET | Freq: Once | ORAL | Status: AC
  Administered 2013-02-28: 1 via ORAL
  Filled 2013-02-28: qty 1

## 2013-02-28 MED ORDER — HYDROCODONE-ACETAMINOPHEN 5-325 MG PO TABS: 2.0000 | ORAL_TABLET | ORAL | Status: DC | PRN

## 2013-02-28 NOTE — ED Provider Notes (Signed)
History     CSN: [id number]  Arrival date & time 02/27/13  2241   First MD Initiated Contact with Patient 02/28/13 0102      Chief Complaint  Patient presents with  . Abscess    (Consider location/radiation/quality/duration/timing/severity/associated sxs/prior treatment) HPI Jaclyn Cruz is a 42 y.o. female who presents to the ED with abscess on her left side. The symptoms started 8 days ago with tenderness and a raised red area. She denies fever or chills. She is a diabetic. The history was provided by the patient.  Past Medical History  Diagnosis Date  . Disc disorder of cervical region   . Hypertension   . High cholesterol   . Mood disorder   . Diabetes mellitus     Past Surgical History  Procedure Laterality Date  . Cholecystectomy    . Tubal ligation      Family History  Problem Relation Age of Onset  . Diabetes Father   . Heart failure Father   . Stroke Father     History  Substance Use Topics  . Smoking status: Current Every Day Smoker    Types: Cigarettes  . Smokeless tobacco: Not on file  . Alcohol Use: No    OB History   Grav Para Term Preterm Abortions TAB SAB Ect Mult Living   4 4  4      4       Review of Systems  Constitutional: Negative for fever, chills and appetite change.  HENT: Negative for neck pain.   Respiratory: Negative for cough.   Gastrointestinal: Negative for nausea, vomiting and abdominal pain.  Skin: Positive for wound.  Neurological: Negative for headaches.  Psychiatric/Behavioral: The patient is not nervous/anxious.     Allergies  Wheat; Soybean-containing drug products; Tape; Codeine; and Compazine  Home Medications   Current Outpatient Rx  Name  Route  Sig  Dispense  Refill  . albuterol (PROVENTIL) (2.5 MG/3ML) 0.083% nebulizer solution   Nebulization   Take 3 mLs (2.5 mg total) by nebulization every 6 (six) hours as needed for wheezing.   75 mL   12   . clonazePAM (KLONOPIN) 0.5 MG tablet   Oral   Take  0.5 mg by mouth 2 (two) times daily.           . cyclobenzaprine (FLEXERIL) 10 MG tablet   Oral   Take 10 mg by mouth 3 (three) times daily.         Marland Kitchen ibuprofen (ADVIL,MOTRIN) 200 MG tablet   Oral   Take 800 mg by mouth every 6 (six) hours as needed for pain.         Marland Kitchen ibuprofen (ADVIL,MOTRIN) 800 MG tablet   Oral   Take 1 tablet (800 mg total) by mouth 3 (three) times daily.   21 tablet   0   . insulin glargine (LANTUS) 100 UNIT/ML injection   Subcutaneous   Inject 60 Units into the skin at bedtime.          . insulin lispro (HUMALOG) 100 UNIT/ML injection   Subcutaneous   Inject 16-22 Units into the skin 3 (three) times daily before meals. Per sliding scale. Pt will not exceed 15 units.         . metFORMIN (GLUCOPHAGE) 500 MG tablet   Oral   Take 500 mg by mouth 2 (two) times daily with a meal.         . oxyCODONE-acetaminophen (PERCOCET/ROXICET) 5-325 MG per tablet   Oral  Take 1 tablet by mouth 2 (two) times daily.         Marland Kitchen oxyCODONE-acetaminophen (PERCOCET/ROXICET) 5-325 MG per tablet   Oral   Take 2 tablets by mouth every 4 (four) hours as needed for pain.   12 tablet   0   . pregabalin (LYRICA) 300 MG capsule   Oral   Take 300 mg by mouth 2 (two) times daily.         Marland Kitchen sulfamethoxazole-trimethoprim (SEPTRA DS) 800-160 MG per tablet   Oral   Take 1 tablet by mouth every 12 (twelve) hours.   14 tablet   0     BP 119/76  Pulse 100  Temp(Src) 97 F (36.1 C) (Oral)  Resp 20  Ht 5\' 4"  (1.626 m)  Wt 180 lb (81.647 kg)  BMI 30.88 kg/m2  SpO2 99%  LMP 01/25/2013  Physical Exam  Nursing note and vitals reviewed. Constitutional: She is oriented to person, place, and time. She appears well-developed and well-nourished. No distress.  HENT:  Head: Normocephalic.  Eyes: EOM are normal.  Neck: Neck supple.  Cardiovascular: Normal rate.   Pulmonary/Chest: Effort normal.  Abdominal: Soft. There is no tenderness.  Musculoskeletal: Normal  range of motion.  Neurological: She is alert and oriented to person, place, and time. No cranial nerve deficit.  Skin: Skin is warm and dry.  There is a raised, tender, erythematous area on the left side of the abdomen.  There is a fluctuant area in the middle.    Psychiatric: She has a normal mood and affect. Her behavior is normal.    ED Course  Procedures (including critical care time) INCISION AND DRAINAGE Performed by: Jaqua Ching Consent: Verbal consent obtained. Risks and benefits: risks, benefits and alternatives were discussed Type: abscess  Body area: left side of abdomen approximately 3 cm in size  Anesthesia: local infiltration  Incision was made with a scalpel. # 11 blade  Local anesthetic: lidocaine 1% without epinephrine  Anesthetic total: 3 ml  Complexity: complex Blunt dissection to break up loculations  Drainage: purulent  Drainage amount: small  Packing material: 1/4 in iodoform gauze  Patient tolerance: Patient tolerated the procedure well with no immediate complications.   MDM  42 y.o. female with abscess to left side of abdomen. Will start antibiotics. She will check her glucose more frequently and follow up with her PCP.  She will return here as needed for problems.  Discussed with the patient and all questioned fully answered.   Medication List    TAKE these medications       HYDROcodone-acetaminophen 5-325 MG per tablet  Commonly known as:  NORCO/VICODIN  Take 2 tablets by mouth every 4 (four) hours as needed for pain.     sulfamethoxazole-trimethoprim 800-160 MG per tablet  Commonly known as:  SEPTRA DS  Take 1 tablet by mouth 2 (two) times daily.      ASK your doctor about these medications       albuterol (2.5 MG/3ML) 0.083% nebulizer solution  Commonly known as:  PROVENTIL  Take 3 mLs (2.5 mg total) by nebulization every 6 (six) hours as needed for wheezing.     clonazePAM 0.5 MG tablet  Commonly known as:  KLONOPIN  Take 0.5  mg by mouth 2 (two) times daily.     cyclobenzaprine 10 MG tablet  Commonly known as:  FLEXERIL  Take 10 mg by mouth 3 (three) times daily.     ibuprofen 800 MG tablet  Commonly known  as:  ADVIL,MOTRIN  Take 1 tablet (800 mg total) by mouth 3 (three) times daily.     ibuprofen 200 MG tablet  Commonly known as:  ADVIL,MOTRIN  Take 800 mg by mouth every 6 (six) hours as needed for pain.     insulin glargine 100 UNIT/ML injection  Commonly known as:  LANTUS  Inject 60 Units into the skin at bedtime.     insulin lispro 100 UNIT/ML injection  Commonly known as:  HUMALOG  Inject 16-22 Units into the skin 3 (three) times daily before meals. Per sliding scale. Pt will not exceed 15 units.     metFORMIN 500 MG tablet  Commonly known as:  GLUCOPHAGE  Take 500 mg by mouth 2 (two) times daily with a meal.     oxyCODONE-acetaminophen 5-325 MG per tablet  Commonly known as:  PERCOCET/ROXICET  Take 1 tablet by mouth 2 (two) times daily.     oxyCODONE-acetaminophen 5-325 MG per tablet  Commonly known as:  PERCOCET/ROXICET  Take 2 tablets by mouth every 4 (four) hours as needed for pain.     pregabalin 300 MG capsule  Commonly known as:  LYRICA  Take 300 mg by mouth 2 (two) times daily.     sulfamethoxazole-trimethoprim 800-160 MG per tablet  Commonly known as:  SEPTRA DS  Take 1 tablet by mouth every 12 (twelve) hours.              [facility] Orlene Och, [place] 03/01/13 [phone]

## 2013-02-28 NOTE — ED Notes (Signed)
Covered wound with sterile gauze. Patient tolerated well. No bleeding or drainage noted at this time.

## 2013-03-01 ENCOUNTER — Ambulatory Visit (INDEPENDENT_AMBULATORY_CARE_PROVIDER_SITE_OTHER): Payer: Medicare Other | Admitting: Family Medicine

## 2013-03-01 VITALS — BP 120/80 | HR 78 | Temp 98.4°F | Resp 18 | Ht 64.0 in | Wt 178.0 lb

## 2013-03-01 DIAGNOSIS — L0291 Cutaneous abscess, unspecified: Secondary | ICD-10-CM

## 2013-03-01 DIAGNOSIS — N3946 Mixed incontinence: Secondary | ICD-10-CM

## 2013-03-01 MED ORDER — OXYCODONE-ACETAMINOPHEN 5-325 MG PO TABS: 1.0000 | ORAL_TABLET | ORAL | Status: DC | PRN

## 2013-03-01 MED ORDER — OXYBUTYNIN CHLORIDE 5 MG PO TABS: 5.0000 mg | ORAL_TABLET | Freq: Two times a day (BID) | ORAL | Status: DC
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
## 2013-03-01 NOTE — Assessment & Plan Note (Signed)
Trial of ditropan

## 2013-03-01 NOTE — Assessment & Plan Note (Addendum)
Start antibiotics, no current drainage, packing removed Care instructions given History recurrent skin abscess/boils I spoke with pt pain management- gave okay to give 10 tablets of percocet due to acute illness

## 2013-03-01 NOTE — ED Provider Notes (Signed)
Medical screening examination/treatment/procedure(s) were performed by non-physician practitioner and as supervising physician I was immediately available for consultation/collaboration.  Trenice Mesa S. Charlsey Moragne, MD 03/01/13 0225 

## 2013-03-01 NOTE — Patient Instructions (Addendum)
Start antibiotics today Oxybutynin for your bladder Clean with anti-baterial soap Start new med

## 2013-03-10 MED FILL — Hydrocodone-Acetaminophen Tab 5-325 MG: ORAL | Qty: 6 | Status: AC

## 2013-03-18 ENCOUNTER — Encounter (HOSPITAL_COMMUNITY): Payer: Self-pay | Admitting: *Deleted

## 2013-03-18 ENCOUNTER — Emergency Department (HOSPITAL_COMMUNITY)
Admission: EM | Admit: 2013-03-18 | Discharge: 2013-03-18 | Disposition: A | Discharge status: Home / Self Care | Payer: Medicare Other | Attending: Emergency Medicine | Admitting: Emergency Medicine

## 2013-03-18 DIAGNOSIS — Z8639 Personal history of other endocrine, nutritional and metabolic disease: Secondary | ICD-10-CM | POA: Insufficient documentation

## 2013-03-18 DIAGNOSIS — Y9389 Activity, other specified: Secondary | ICD-10-CM | POA: Insufficient documentation

## 2013-03-18 DIAGNOSIS — IMO0001 Reserved for inherently not codable concepts without codable children: Secondary | ICD-10-CM | POA: Insufficient documentation

## 2013-03-18 DIAGNOSIS — E119 Type 2 diabetes mellitus without complications: Secondary | ICD-10-CM | POA: Insufficient documentation

## 2013-03-18 DIAGNOSIS — F17200 Nicotine dependence, unspecified, uncomplicated: Secondary | ICD-10-CM | POA: Insufficient documentation

## 2013-03-18 DIAGNOSIS — Z792 Long term (current) use of antibiotics: Secondary | ICD-10-CM | POA: Insufficient documentation

## 2013-03-18 DIAGNOSIS — Z862 Personal history of diseases of the blood and blood-forming organs and certain disorders involving the immune mechanism: Secondary | ICD-10-CM | POA: Insufficient documentation

## 2013-03-18 DIAGNOSIS — I10 Essential (primary) hypertension: Secondary | ICD-10-CM | POA: Insufficient documentation

## 2013-03-18 DIAGNOSIS — Z8739 Personal history of other diseases of the musculoskeletal system and connective tissue: Secondary | ICD-10-CM | POA: Insufficient documentation

## 2013-03-18 DIAGNOSIS — Z79899 Other long term (current) drug therapy: Secondary | ICD-10-CM | POA: Insufficient documentation

## 2013-03-18 DIAGNOSIS — Y9289 Other specified places as the place of occurrence of the external cause: Secondary | ICD-10-CM | POA: Insufficient documentation

## 2013-03-18 DIAGNOSIS — W57XXXA Bitten or stung by nonvenomous insect and other nonvenomous arthropods, initial encounter: Secondary | ICD-10-CM

## 2013-03-18 DIAGNOSIS — F39 Unspecified mood [affective] disorder: Secondary | ICD-10-CM | POA: Insufficient documentation

## 2013-03-18 MED ORDER — DIPHENHYDRAMINE HCL 25 MG PO CAPS
25.0000 mg | ORAL_CAPSULE | Freq: Once | ORAL | Status: AC
  Administered 2013-03-18: 25 mg via ORAL
  Filled 2013-03-18: qty 1

## 2013-03-18 MED ORDER — ACETAMINOPHEN 500 MG PO TABS
1000.0000 mg | ORAL_TABLET | Freq: Once | ORAL | Status: DC
  Filled 2013-03-18: qty 2

## 2013-03-18 NOTE — ED Notes (Signed)
Pt got bit under the left arm.

## 2013-03-18 NOTE — ED Provider Notes (Signed)
History    CSN: [id number] Arrival date & time 03/18/13  2326  First MD Initiated Contact with Patient 03/18/13 2332     Chief Complaint  Patient presents with  . Insect Bite   (Consider location/radiation/quality/duration/timing/severity/associated sxs/prior Treatment) HPI HPI Comments: Jaclyn Cruz is a 42 y.o. female who presents to the Emergency Department complaining of an insect bite she just received to the back of her left arm. She was leaning on a fence and something bit or stung her. It is burning at the present time.   PCp Dr. Tanya Nones  Past Medical History  Diagnosis Date  . Disc disorder of cervical region   . Hypertension   . High cholesterol   . Mood disorder   . Diabetes mellitus    Past Surgical History  Procedure Laterality Date  . Cholecystectomy    . Tubal ligation     Family History  Problem Relation Age of Onset  . Diabetes Father   . Heart failure Father   . Stroke Father    History  Substance Use Topics  . Smoking status: Current Every Day Smoker    Types: Cigarettes  . Smokeless tobacco: Not on file  . Alcohol Use: No   OB History   Grav Para Term Preterm Abortions TAB SAB Ect Mult Living   4 4  4      4      Review of Systems  Constitutional: Negative for fever.       10 Systems reviewed and are negative for acute change except as noted in the HPI.  HENT: Negative for congestion.   Eyes: Negative for discharge and redness.  Respiratory: Negative for cough and shortness of breath.   Cardiovascular: Negative for chest pain.  Gastrointestinal: Negative for vomiting and abdominal pain.  Musculoskeletal: Negative for back pain.  Skin: Negative for rash.       Bite/sting to left arm  Neurological: Negative for syncope, numbness and headaches.  Psychiatric/Behavioral:       No behavior change.    Allergies  Wheat; Soybean-containing drug products; Tape; Codeine; and Compazine  Home Medications   Current Outpatient Rx  Name   Route  Sig  Dispense  Refill  . albuterol (PROVENTIL) (2.5 MG/3ML) 0.083% nebulizer solution   Nebulization   Take 3 mLs (2.5 mg total) by nebulization every 6 (six) hours as needed for wheezing.   75 mL   12   . clonazePAM (KLONOPIN) 0.5 MG tablet   Oral   Take 0.5 mg by mouth 2 (two) times daily.           . cyclobenzaprine (FLEXERIL) 10 MG tablet   Oral   Take 10 mg by mouth 3 (three) times daily.         Marland Kitchen HYDROcodone-acetaminophen (NORCO/VICODIN) 5-325 MG per tablet   Oral   Take 2 tablets by mouth every 4 (four) hours as needed for pain.   6 tablet   0   . ibuprofen (ADVIL,MOTRIN) 200 MG tablet   Oral   Take 800 mg by mouth every 6 (six) hours as needed for pain.         Marland Kitchen ibuprofen (ADVIL,MOTRIN) 800 MG tablet   Oral   Take 1 tablet (800 mg total) by mouth 3 (three) times daily.   21 tablet   0   . insulin glargine (LANTUS) 100 UNIT/ML injection   Subcutaneous   Inject 60 Units into the skin at bedtime.          Marland Kitchen  insulin lispro (HUMALOG) 100 UNIT/ML injection   Subcutaneous   Inject 16-22 Units into the skin 3 (three) times daily before meals. Per sliding scale. Pt will not exceed 15 units.         . metFORMIN (GLUCOPHAGE) 500 MG tablet   Oral   Take 500 mg by mouth 2 (two) times daily with a meal.         . oxybutynin (DITROPAN) 5 MG tablet   Oral   Take 1 tablet (5 mg total) by mouth 2 (two) times daily.   60 tablet   3   . oxyCODONE-acetaminophen (PERCOCET/ROXICET) 5-325 MG per tablet   Oral   Take 2 tablets by mouth every 4 (four) hours as needed for pain.   12 tablet   0   . oxyCODONE-acetaminophen (PERCOCET/ROXICET) 5-325 MG per tablet   Oral   Take 1 tablet by mouth every 4 (four) hours as needed for pain.   10 tablet   0     Discussed with her pain management, okay to dispen ...   . pregabalin (LYRICA) 300 MG capsule   Oral   Take 300 mg by mouth 2 (two) times daily.         Marland Kitchen sulfamethoxazole-trimethoprim (SEPTRA DS)  800-160 MG per tablet   Oral   Take 1 tablet by mouth every 12 (twelve) hours.   14 tablet   0   . sulfamethoxazole-trimethoprim (SEPTRA DS) 800-160 MG per tablet   Oral   Take 1 tablet by mouth 2 (two) times daily.   28 tablet   0    BP 141/102  Pulse 101  Temp(Src) 98.3 F (36.8 C) (Oral)  Ht 5\' 4"  (1.626 m)  Wt 170 lb (77.111 kg)  BMI 29.17 kg/m2  SpO2 99%  LMP 03/10/2013 Physical Exam  Nursing note and vitals reviewed. Constitutional: She appears well-developed and well-nourished.  Awake, alert, nontoxic appearance.  HENT:  Head: Atraumatic.  Eyes: Right eye exhibits no discharge. Left eye exhibits no discharge.  Neck: Neck supple.  Cardiovascular: Normal rate and intact distal pulses.   Pulmonary/Chest: Effort normal and breath sounds normal. She exhibits no tenderness.  Abdominal: Soft. Bowel sounds are normal. There is no tenderness. There is no rebound.  Musculoskeletal: She exhibits no tenderness.  Baseline ROM, no obvious new focal weakness.  Neurological:  Mental status and motor strength appears baseline for patient and situation.  Skin: No rash noted.  Small papule c/w mosquito bite or ant bite to back of left arm  Psychiatric: She has a normal mood and affect.    ED Course  Procedures (including critical care time)   MDM  Patient received an insect bite to the back of her left arm just prior to arrival. Given tylenol and benadryl. Pt stable in ED with no significant deterioration in condition.The patient appears reasonably screened and/or stabilized for discharge and I doubt any other medical condition or other [facility] requiring further screening, evaluation, or treatment in the ED at this time prior to discharge.  MDM Reviewed: nursing note and vitals     Nicoletta Dress. Colon Branch, MD 03/18/13 [phone]

## 2013-03-28 ENCOUNTER — Encounter (HOSPITAL_COMMUNITY): Payer: Self-pay | Admitting: *Deleted

## 2013-03-28 DIAGNOSIS — Z79899 Other long term (current) drug therapy: Secondary | ICD-10-CM | POA: Insufficient documentation

## 2013-03-28 DIAGNOSIS — Z8659 Personal history of other mental and behavioral disorders: Secondary | ICD-10-CM | POA: Insufficient documentation

## 2013-03-28 DIAGNOSIS — E7800 Pure hypercholesterolemia, unspecified: Secondary | ICD-10-CM | POA: Insufficient documentation

## 2013-03-28 DIAGNOSIS — Y9289 Other specified places as the place of occurrence of the external cause: Secondary | ICD-10-CM | POA: Insufficient documentation

## 2013-03-28 DIAGNOSIS — F17200 Nicotine dependence, unspecified, uncomplicated: Secondary | ICD-10-CM | POA: Insufficient documentation

## 2013-03-28 DIAGNOSIS — X500XXA Overexertion from strenuous movement or load, initial encounter: Secondary | ICD-10-CM | POA: Insufficient documentation

## 2013-03-28 DIAGNOSIS — Z794 Long term (current) use of insulin: Secondary | ICD-10-CM | POA: Insufficient documentation

## 2013-03-28 DIAGNOSIS — I10 Essential (primary) hypertension: Secondary | ICD-10-CM | POA: Insufficient documentation

## 2013-03-28 DIAGNOSIS — Z8739 Personal history of other diseases of the musculoskeletal system and connective tissue: Secondary | ICD-10-CM | POA: Insufficient documentation

## 2013-03-28 DIAGNOSIS — E119 Type 2 diabetes mellitus without complications: Secondary | ICD-10-CM | POA: Insufficient documentation

## 2013-03-28 DIAGNOSIS — Z791 Long term (current) use of non-steroidal anti-inflammatories (NSAID): Secondary | ICD-10-CM | POA: Insufficient documentation

## 2013-03-28 DIAGNOSIS — IMO0002 Reserved for concepts with insufficient information to code with codable children: Secondary | ICD-10-CM | POA: Insufficient documentation

## 2013-03-28 DIAGNOSIS — Y939 Activity, unspecified: Secondary | ICD-10-CM | POA: Insufficient documentation

## 2013-03-28 NOTE — ED Notes (Signed)
Back pain for 1 week, neck pain also, Felt a pop in her back .

## 2013-03-29 ENCOUNTER — Emergency Department (HOSPITAL_COMMUNITY)
Admission: EM | Admit: 2013-03-29 | Discharge: 2013-03-29 | Disposition: A | Discharge status: Home / Self Care | Payer: Medicare Other | Attending: Emergency Medicine | Admitting: Emergency Medicine

## 2013-03-29 DIAGNOSIS — M545 Low back pain: Secondary | ICD-10-CM

## 2013-03-29 MED ORDER — IBUPROFEN 600 MG PO TABS: 600.0000 mg | ORAL_TABLET | Freq: Three times a day (TID) | ORAL | Status: DC | PRN

## 2013-03-29 MED ORDER — PREDNISONE 10 MG PO TABS: 60.0000 mg | ORAL_TABLET | Freq: Every day | ORAL | Status: DC

## 2013-03-29 MED ORDER — CYCLOBENZAPRINE HCL 10 MG PO TABS: 10.0000 mg | ORAL_TABLET | Freq: Two times a day (BID) | ORAL | Status: DC | PRN

## 2013-03-29 NOTE — ED Provider Notes (Signed)
History    CSN: [id number] Arrival date & time 03/28/13  2251  First MD Initiated Contact with Patient 03/29/13 0023     Chief Complaint  Patient presents with  . Back Pain    The history is provided by the patient.   patient reports injuring her back approximately one week ago while in the garden.  She feels that she heard a "pop".  She denies weakness in her lower extremities.  She reports moderate to severe pain in her left lower back.  No urinary symptoms.  No fevers or chills.  No abdominal pain.  No nausea or vomiting.  She's tried ibuprofen for the pain without improvement in the symptoms. Past Medical History  Diagnosis Date  . Disc disorder of cervical region   . Hypertension   . High cholesterol   . Mood disorder   . Diabetes mellitus    Past Surgical History  Procedure Laterality Date  . Cholecystectomy    . Tubal ligation     Family History  Problem Relation Age of Onset  . Diabetes Father   . Heart failure Father   . Stroke Father    History  Substance Use Topics  . Smoking status: Current Every Day Smoker    Types: Cigarettes  . Smokeless tobacco: Not on file  . Alcohol Use: No   OB History   Grav Para Term Preterm Abortions TAB SAB Ect Mult Living   4 4  4      4      Review of Systems  Musculoskeletal: Positive for back pain.  All other systems reviewed and are negative.    Allergies  Wheat; Soybean-containing drug products; Tape; Codeine; and Compazine  Home Medications   Current Outpatient Rx  Name  Route  Sig  Dispense  Refill  . albuterol (PROVENTIL) (2.5 MG/3ML) 0.083% nebulizer solution   Nebulization   Take 3 mLs (2.5 mg total) by nebulization every 6 (six) hours as needed for wheezing.   75 mL   12   . clonazePAM (KLONOPIN) 0.5 MG tablet   Oral   Take 0.5 mg by mouth 2 (two) times daily.           . cyclobenzaprine (FLEXERIL) 10 MG tablet   Oral   Take 10 mg by mouth 3 (three) times daily.         . cyclobenzaprine  (FLEXERIL) 10 MG tablet   Oral   Take 1 tablet (10 mg total) by mouth 2 (two) times daily as needed for muscle spasms.   20 tablet   0   . HYDROcodone-acetaminophen (NORCO/VICODIN) 5-325 MG per tablet   Oral   Take 2 tablets by mouth every 4 (four) hours as needed for pain.   6 tablet   0   . ibuprofen (ADVIL,MOTRIN) 200 MG tablet   Oral   Take 800 mg by mouth every 6 (six) hours as needed for pain.         Marland Kitchen ibuprofen (ADVIL,MOTRIN) 600 MG tablet   Oral   Take 1 tablet (600 mg total) by mouth every 8 (eight) hours as needed for pain.   15 tablet   0   . ibuprofen (ADVIL,MOTRIN) 800 MG tablet   Oral   Take 1 tablet (800 mg total) by mouth 3 (three) times daily.   21 tablet   0   . insulin glargine (LANTUS) 100 UNIT/ML injection   Subcutaneous   Inject 60 Units into the skin at bedtime.          Marland Kitchen  insulin lispro (HUMALOG) 100 UNIT/ML injection   Subcutaneous   Inject 16-22 Units into the skin 3 (three) times daily before meals. Per sliding scale. Pt will not exceed 15 units.         . metFORMIN (GLUCOPHAGE) 500 MG tablet   Oral   Take 500 mg by mouth 2 (two) times daily with a meal.         . oxybutynin (DITROPAN) 5 MG tablet   Oral   Take 1 tablet (5 mg total) by mouth 2 (two) times daily.   60 tablet   3   . oxyCODONE-acetaminophen (PERCOCET/ROXICET) 5-325 MG per tablet   Oral   Take 2 tablets by mouth every 4 (four) hours as needed for pain.   12 tablet   0   . oxyCODONE-acetaminophen (PERCOCET/ROXICET) 5-325 MG per tablet   Oral   Take 1 tablet by mouth every 4 (four) hours as needed for pain.   10 tablet   0     Discussed with her pain management, okay to dispen ...   . predniSONE (DELTASONE) 10 MG tablet   Oral   Take 6 tablets (60 mg total) by mouth daily.   30 tablet   0   . pregabalin (LYRICA) 300 MG capsule   Oral   Take 300 mg by mouth 2 (two) times daily.         Marland Kitchen sulfamethoxazole-trimethoprim (SEPTRA DS) 800-160 MG per  tablet   Oral   Take 1 tablet by mouth every 12 (twelve) hours.   14 tablet   0   . sulfamethoxazole-trimethoprim (SEPTRA DS) 800-160 MG per tablet   Oral   Take 1 tablet by mouth 2 (two) times daily.   28 tablet   0    BP 137/88  Pulse 98  Temp(Src) 98 F (36.7 C) (Oral)  Resp 20  Ht 5\' 4"  (1.626 m)  Wt 179 lb (81.194 kg)  BMI 30.71 kg/m2  SpO2 97%  LMP 03/13/2013 Physical Exam  Nursing note and vitals reviewed. Constitutional: She is oriented to person, place, and time. She appears well-developed and well-nourished. No distress.  HENT:  Head: Normocephalic and atraumatic.  Eyes: EOM are normal.  Neck: Normal range of motion.  Cardiovascular: Normal rate, regular rhythm and normal heart sounds.   Pulmonary/Chest: Effort normal and breath sounds normal.  Abdominal: Soft. She exhibits no distension. There is no tenderness.  Musculoskeletal: Normal range of motion.  Mild low back spasm on the left.  No rash.  5 out of 5 strength in bilateral lower extremity major muscle groups  Neurological: She is alert and oriented to person, place, and time.  Skin: Skin is warm and dry.  Psychiatric: She has a normal mood and affect. Judgment normal.    ED Course  Procedures (including critical care time) Labs Reviewed - No data to display No results found. 1. Low back pain     MDM  Musculoskeletal back pain.  Home with steroids and most.  She has a pain contract  Lyanne Co, MD 03/29/13 [phone]

## 2013-03-29 NOTE — ED Notes (Signed)
Pt alert & oriented x4, stable gait. Patient given discharge instructions, paperwork & prescription(s). Patient  instructed to stop at the registration desk to finish any additional paperwork. Patient verbalized understanding. Pt left department w/ no further questions. 

## 2013-04-16 ENCOUNTER — Other Ambulatory Visit: Payer: Self-pay | Admitting: Neurology

## 2013-04-16 ENCOUNTER — Ambulatory Visit (HOSPITAL_COMMUNITY)
Admission: RE | Admit: 2013-04-16 | Discharge: 2013-04-16 | Disposition: A | Discharge status: Home / Self Care | Payer: Medicare Other | Source: Ambulatory Visit | Attending: Neurology | Admitting: Neurology

## 2013-04-16 DIAGNOSIS — M545 Low back pain, unspecified: Secondary | ICD-10-CM | POA: Insufficient documentation

## 2013-08-04 ENCOUNTER — Encounter: Payer: Self-pay | Admitting: Family Medicine

## 2013-08-04 ENCOUNTER — Ambulatory Visit (INDEPENDENT_AMBULATORY_CARE_PROVIDER_SITE_OTHER): Payer: Medicare Other | Admitting: Family Medicine

## 2013-08-04 VITALS — BP 118/78 | HR 72 | Temp 97.6°F | Resp 20 | Ht 64.0 in | Wt 181.0 lb

## 2013-08-04 DIAGNOSIS — F3130 Bipolar disorder, current episode depressed, mild or moderate severity, unspecified: Secondary | ICD-10-CM

## 2013-08-04 MED ORDER — ARIPIPRAZOLE 5 MG PO TABS: 2.5000 mg | ORAL_TABLET | Freq: Every day | ORAL | Status: DC

## 2013-08-04 MED ORDER — VENLAFAXINE HCL ER 75 MG PO CP24: 150.0000 mg | ORAL_CAPSULE | Freq: Every day | ORAL | Status: DC

## 2013-08-04 NOTE — Progress Notes (Signed)
Subjective:    Patient ID: Jaclyn Cruz, female    DOB: 09/23/1970, 42 y.o.   MRN: [id number]  HPI  Patient history of manic depression. In the past she has been tried on Effexor with some success.  She is using lithium and Depakote his mood stabilizers. These caused intractable nausea and vomiting. She has also tried wrist at all, Seroquel, and Zyprexa all of which made her "a zombie".  Present she reports severe depression. She's having trouble turning off her mind at night so she cannot sleep. She admits to occasionally hearing voices which he knows are not there. She denies any suicidal ideation or homicidal ideation. She has appropriate judgment. She is very concerned that she may spiral out of control if she does not seek help immediately. She's been off her medicines for several months after failing to follow with her psychiatrist.. Past Medical History  Diagnosis Date  . Disc disorder of cervical region   . Hypertension   . High cholesterol   . Mood disorder   . Diabetes mellitus    Current Outpatient Prescriptions on File Prior to Visit  Medication Sig Dispense Refill  . cyclobenzaprine (FLEXERIL) 10 MG tablet Take 1 tablet (10 mg total) by mouth 2 (two) times daily as needed for muscle spasms.  20 tablet  0  . ibuprofen (ADVIL,MOTRIN) 200 MG tablet Take 800 mg by mouth every 6 (six) hours as needed for pain.      Marland Kitchen insulin glargine (LANTUS) 100 UNIT/ML injection Inject 60 Units into the skin at bedtime.       . insulin lispro (HUMALOG) 100 UNIT/ML injection Inject 16-22 Units into the skin 3 (three) times daily before meals. Per sliding scale. Pt will not exceed 15 units.      Marland Kitchen oxyCODONE-acetaminophen (PERCOCET/ROXICET) 5-325 MG per tablet Take 2 tablets by mouth every 4 (four) hours as needed for pain.  12 tablet  0  . pregabalin (LYRICA) 300 MG capsule Take 300 mg by mouth 2 (two) times daily.      . metFORMIN (GLUCOPHAGE) 500 MG tablet Take 500 mg by mouth 2 (two) times  daily with a meal.       No current facility-administered medications on file prior to visit.   Allergies  Allergen Reactions  . Wheat Shortness Of Breath and Other (See Comments)    Causes severe coughing  . Soybean-Containing Drug Products   . Tape Other (See Comments)    ALLERGIC to BANDAGES: causes infection to area that is exposed  . Codeine Itching, Swelling and Rash  . Compazine Itching and Rash   History   Social History  . Marital Status: Married    Spouse Name: N/A    Number of Children: N/A  . Years of Education: N/A   Occupational History  . Not on file.   Social History Main Topics  . Smoking status: Current Every Day Smoker    Types: Cigarettes  . Smokeless tobacco: Not on file  . Alcohol Use: No  . Drug Use: No  . Sexual Activity: Yes    Birth Control/ Protection: None, Surgical   Other Topics Concern  . Not on file   Social History Narrative  . No narrative on file     Review of Systems  All other systems reviewed and are negative.       Objective:   Physical Exam  Vitals reviewed. Psychiatric: Her behavior is normal. Judgment and thought content normal. Her mood appears anxious. She  exhibits a depressed mood.          Assessment & Plan:  1. Bipolar I disorder, most recent episode (or current) depressed, unspecified Start Effexor XR 75 mg by mouth every morning. In one week increase from 1 tablet to 2 tablets a day. Recheck in one week. I will also start Abilify 2.5 mg by mouth daily as he may stabilizer. In one week and may increase to 5 mg a day. - venlafaxine XR (EFFEXOR-XR) 75 MG 24 hr capsule; Take 2 capsules (150 mg total) by mouth daily with breakfast.  Dispense: 60 capsule; Refill: 1 - ARIPiprazole (ABILIFY) 5 MG tablet; Take 0.5 tablets (2.5 mg total) by mouth daily.  Dispense: 30 tablet; Refill: 1

## 2013-08-12 ENCOUNTER — Ambulatory Visit (INDEPENDENT_AMBULATORY_CARE_PROVIDER_SITE_OTHER): Payer: Medicare Other | Admitting: Family Medicine

## 2013-08-12 ENCOUNTER — Encounter: Payer: Self-pay | Admitting: Family Medicine

## 2013-08-12 VITALS — BP 120/72 | HR 78 | Temp 98.2°F | Resp 18 | Ht 64.0 in | Wt 180.0 lb

## 2013-08-12 DIAGNOSIS — F3130 Bipolar disorder, current episode depressed, mild or moderate severity, unspecified: Secondary | ICD-10-CM

## 2013-08-12 DIAGNOSIS — G589 Mononeuropathy, unspecified: Secondary | ICD-10-CM

## 2013-08-12 DIAGNOSIS — G629 Polyneuropathy, unspecified: Secondary | ICD-10-CM

## 2013-08-12 LAB — COMPLETE METABOLIC PANEL WITH GFR
Alkaline Phosphatase: 76 U/L (ref 39–117)
BUN: 10 mg/dL (ref 6–23)
Creat: 0.54 mg/dL (ref 0.50–1.10)
GFR, Est Non African American: >89 mL/min
Glucose, Bld: 278 mg/dL — ABNORMAL HIGH (ref 70–99)
Total Bilirubin: 0.3 mg/dL (ref 0.3–1.2)

## 2013-08-12 LAB — CBC WITH DIFFERENTIAL/PLATELET
Basophils Absolute: 0 10*3/uL (ref 0.0–0.1)
HCT: 46.3 % — ABNORMAL HIGH (ref 36.0–46.0)
Hemoglobin: 16.8 g/dL — ABNORMAL HIGH (ref 12.0–15.0)
Lymphocytes Relative: 34 % (ref 12–46)
Monocytes Absolute: 0.7 10*3/uL (ref 0.1–1.0)
Monocytes Relative: 7 % (ref 3–12)
Neutro Abs: 5.8 10*3/uL (ref 1.7–7.7)
Neutrophils Relative %: 57 % (ref 43–77)
WBC: 10.2 10*3/uL (ref 4.0–10.5)

## 2013-08-12 LAB — HEMOGLOBIN A1C: Hgb A1c MFr Bld: 10.1 % — ABNORMAL HIGH (ref <5.7)

## 2013-08-12 LAB — TSH: TSH: 0.925 u[IU]/mL (ref 0.350–4.500)

## 2013-08-12 LAB — VITAMIN B12: Vitamin B-12: 802 pg/mL (ref 211–911)

## 2013-08-12 MED ORDER — GLUCOSE BLOOD VI STRP: ORAL_STRIP | Status: DC

## 2013-08-12 NOTE — Progress Notes (Signed)
Subjective:    Patient ID: Jaclyn Cruz, female    DOB: 12-26-1970, 42 y.o.   MRN: [id number]  HPI  08/04/13 Patient history of manic depression. In the past she has been tried on Effexor with some success.  She is using lithium and Depakote his mood stabilizers. These caused intractable nausea and vomiting. She has also tried wrist at all, Seroquel, and Zyprexa all of which made her "a zombie".  Present she reports severe depression. She's having trouble turning off her mind at night so she cannot sleep. She admits to occasionally hearing voices which he knows are not there. She denies any suicidal ideation or homicidal ideation. She has appropriate judgment. She is very concerned that she may spiral out of control if she does not seek help immediately. She's been off her medicines for several months after failing to follow with her psychiatrist..  Atthat time, my plan was: 1. Bipolar I disorder, most recent episode (or current) depressed, unspecified Start Effexor XR 75 mg by mouth every morning. In one week increase from 1 tablet to 2 tablets a day. Recheck in one week. I will also start Abilify 2.5 mg by mouth daily as a mood stabilizer. In one week, I may increase to 5 mg a day. - venlafaxine XR (EFFEXOR-XR) 75 MG 24 hr capsule; Take 2 capsules (150 mg total) by mouth daily with breakfast.  Dispense: 60 capsule; Refill: 1 - ARIPiprazole (ABILIFY) 5 MG tablet; Take 0.5 tablets (2.5 mg total) by mouth daily.  Dispense: 30 tablet; Refill: 1  08/12/13 Patient is here today for followup.  Patient's mood is slowly starting to improve. Her racing thoughts have improved slightly. She is still very somnolent and sleepy on the Abilify but is doing somewhat better. Unfortunately she describes diffuse itching all over her body. There is no visible rash to see.  She also complains of burning severe pain on the dorsums of both feet that has been present off and on for last 2-3 years. Of note the patient  has uncontrolled type 2 diabetes mellitus. She is currently on Lantus 60 units daily along with metformin. She states that her average blood sugars are 200-300. She is on maximum dose Lyrica through pain management.  Past Medical History  Diagnosis Date  . Disc disorder of cervical region   . Hypertension   . High cholesterol   . Mood disorder   . Diabetes mellitus    Current Outpatient Prescriptions on File Prior to Visit  Medication Sig Dispense Refill  . ARIPiprazole (ABILIFY) 5 MG tablet Take 0.5 tablets (2.5 mg total) by mouth daily.  30 tablet  1  . cyclobenzaprine (FLEXERIL) 10 MG tablet Take 1 tablet (10 mg total) by mouth 2 (two) times daily as needed for muscle spasms.  20 tablet  0  . ibuprofen (ADVIL,MOTRIN) 200 MG tablet Take 800 mg by mouth every 6 (six) hours as needed for pain.      Marland Kitchen insulin glargine (LANTUS) 100 UNIT/ML injection Inject 60 Units into the skin at bedtime.       . insulin lispro (HUMALOG) 100 UNIT/ML injection Inject 16-22 Units into the skin 3 (three) times daily before meals. Per sliding scale. Pt will not exceed 15 units.      . metFORMIN (GLUCOPHAGE) 500 MG tablet Take 500 mg by mouth 2 (two) times daily with a meal.      . oxyCODONE-acetaminophen (PERCOCET/ROXICET) 5-325 MG per tablet Take 2 tablets by mouth every 4 (four) hours  as needed for pain.  12 tablet  0  . pregabalin (LYRICA) 300 MG capsule Take 300 mg by mouth 2 (two) times daily.      Marland Kitchen venlafaxine XR (EFFEXOR-XR) 75 MG 24 hr capsule Take 2 capsules (150 mg total) by mouth daily with breakfast.  60 capsule  1   No current facility-administered medications on file prior to visit.   Allergies  Allergen Reactions  . Wheat Shortness Of Breath and Other (See Comments)    Causes severe coughing  . Soybean-Containing Drug Products   . Tape Other (See Comments)    ALLERGIC to BANDAGES: causes infection to area that is exposed  . Codeine Itching, Swelling and Rash  . Compazine Itching and Rash    History   Social History  . Marital Status: Married    Spouse Name: N/A    Number of Children: N/A  . Years of Education: N/A   Occupational History  . Not on file.   Social History Main Topics  . Smoking status: Current Every Day Smoker    Types: Cigarettes  . Smokeless tobacco: Not on file  . Alcohol Use: No  . Drug Use: No  . Sexual Activity: Yes    Birth Control/ Protection: None, Surgical   Other Topics Concern  . Not on file   Social History Narrative  . No narrative on file     Review of Systems  All other systems reviewed and are negative.       Objective:   Physical Exam  Vitals reviewed. Cardiovascular: Normal rate and regular rhythm.   Pulmonary/Chest: Effort normal and breath sounds normal.  Abdominal: Soft. Bowel sounds are normal.  Skin: Skin is warm. No rash noted. No erythema. No pallor.  Psychiatric: Her behavior is normal. Judgment and thought content normal. Her mood appears anxious. She exhibits a depressed mood.          Assessment & Plan:  1. Neuropathy I suspect the pain in her feet is peripheral neuropathy due to uncontrolled diabetes mellitus type 2.  I will however check lab work to rule out other causes of neuropathy. The itching all over her skin may also be due to neuropathy or possibly her anxiety. I will rule out metabolic causes of itching with a CBC and CMP. If all lab work is normal aside from her blood sugar test, I feel we need to control her blood sugars more appropriately to help control her neuropathy. Increase Lantus insulin until her fasting blood sugars fall below 130.  I will wait the results of hemoglobin A1c to confirm prior to making any changes in her Lantus - COMPLETE METABOLIC PANEL WITH GFR - CBC with Differential - TSH - Vitamin B12 - Hemoglobin A1c  2. Type II or unspecified type diabetes mellitus without mention of complication, uncontrolled My tentative plan is to increase Lantus one unit per day until  fasting blood sugars fall below 130. - Hemoglobin A1c  3. Bipolar I disorder, most recent episode (or current) depressed, unspecified Slowly starting to improve. Continue Effexor at 150 mg by mouth daily. Continue Abilify at 2.5 mg by mouth each bedtime. I will not increase Abilify due to her hyperglycemia.

## 2013-08-17 ENCOUNTER — Ambulatory Visit (INDEPENDENT_AMBULATORY_CARE_PROVIDER_SITE_OTHER): Payer: Medicare Other | Admitting: Family Medicine

## 2013-08-17 ENCOUNTER — Encounter: Payer: Self-pay | Admitting: Family Medicine

## 2013-08-17 ENCOUNTER — Other Ambulatory Visit: Payer: Self-pay | Admitting: Family Medicine

## 2013-08-17 VITALS — BP 120/80 | HR 78 | Temp 98.0°F | Resp 18 | Ht 64.0 in | Wt 179.0 lb

## 2013-08-17 DIAGNOSIS — F319 Bipolar disorder, unspecified: Secondary | ICD-10-CM

## 2013-08-17 DIAGNOSIS — N949 Unspecified condition associated with female genital organs and menstrual cycle: Secondary | ICD-10-CM

## 2013-08-17 DIAGNOSIS — N925 Other specified irregular menstruation: Secondary | ICD-10-CM

## 2013-08-17 DIAGNOSIS — Z124 Encounter for screening for malignant neoplasm of cervix: Secondary | ICD-10-CM

## 2013-08-17 DIAGNOSIS — N938 Other specified abnormal uterine and vaginal bleeding: Secondary | ICD-10-CM

## 2013-08-17 LAB — WET PREP FOR TRICH, YEAST, CLUE
Clue Cells Wet Prep HPF POC: NONE SEEN
Trich, Wet Prep: NONE SEEN

## 2013-08-17 MED ORDER — CETIRIZINE HCL 10 MG PO TABS: 10.0000 mg | ORAL_TABLET | Freq: Every day | ORAL | Status: DC

## 2013-08-17 NOTE — Patient Instructions (Signed)
Hormone labs to be done Continue current medications Zyrtec sent in Ultrasound of uterus to be done Referral to psychiatry for the bipolar F/U Dr. Tanya Nones as previous

## 2013-08-17 NOTE — Telephone Encounter (Signed)
Pt is returning your call about lab results Call back number is [phone]

## 2013-08-18 ENCOUNTER — Encounter: Payer: Self-pay | Admitting: Family Medicine

## 2013-08-18 DIAGNOSIS — F319 Bipolar disorder, unspecified: Secondary | ICD-10-CM | POA: Insufficient documentation

## 2013-08-18 DIAGNOSIS — N938 Other specified abnormal uterine and vaginal bleeding: Secondary | ICD-10-CM | POA: Insufficient documentation

## 2013-08-18 LAB — PAP THINPREP ASCUS RFLX HPV RFLX TYPE

## 2013-08-18 LAB — FSH/LH: LH: 2.8 m[IU]/mL

## 2013-08-18 NOTE — Assessment & Plan Note (Signed)
Concern for her abnormal bleeding pattern. I'll obtain a Pap smear she's not had one of these in greater than 5 years. Vaginal cultures are also done. Vaginal ultrasound will be done. She will likely need referral to GYN if we cannot find a reason for the bleeding. I do not think bad hormone therapy is good for her one because of her psychiatric medications also because of her obesity and smoking.

## 2013-08-18 NOTE — Progress Notes (Signed)
   Subjective:    Patient ID: Jaclyn Cruz, female    DOB: [birth year], 42 y.o.   MRN: [id number]  HPI Patient here with concern for menopause and abnormal bleeding. For the past 8 months she's had 2-3 periods a month. They typically last a least 5 days and can be heavy. She states that this is affecting her relationship as well as her drive. She was noted she's going into menopause and she wants to try a medication such as Osphena. She has other symptoms such as hot flashes and vaginal dryness. She gets mood swings however she has known bipolar disorder. She states that she missed her previous psychiatry visit because she had no transportation but she would like to be referred back to her psychiatrist.  Pt asked for refill on zyrtec   Review of Systems - per above  GEN- denies fatigue, fever, weight loss,weakness, recent illness HEENT- denies eye drainage, change in vision, nasal discharge, CVS- denies chest pain, palpitations RESP- denies SOB, cough, wheeze ABD- denies N/V, change in stools, abd pain GU- denies dysuria, hematuria, dribbling, incontinence Neuro- denies headache, dizziness, syncope, seizure activity      Objective:   Physical Exam  GEN- NAD, alert and oriented x 3,   CVS-RRR, no murmur RESP-CTAB  ABD-NABS,soft,NT,ND GU- normal external genitalia, vaginal mucosa pink and moist, cervix visualized no growth, + blood form os, + discharge, no CMT, no ovarian masses, uterus difficult to palpate Ext- No edema Psych- Normal affect and Mood       Assessment & Plan:

## 2013-08-18 NOTE — Assessment & Plan Note (Signed)
Referral back to psychiatry

## 2013-08-19 NOTE — Telephone Encounter (Signed)
This encounter was created in error - please disregard.

## 2013-08-22 ENCOUNTER — Ambulatory Visit
Admission: RE | Admit: 2013-08-22 | Discharge: 2013-08-22 | Disposition: A | Discharge status: Home / Self Care | Payer: Medicare Other | Source: Ambulatory Visit | Attending: Family Medicine | Admitting: Family Medicine

## 2013-08-22 DIAGNOSIS — N938 Other specified abnormal uterine and vaginal bleeding: Secondary | ICD-10-CM

## 2013-08-23 ENCOUNTER — Other Ambulatory Visit: Payer: Self-pay | Admitting: Family Medicine

## 2013-08-23 DIAGNOSIS — N938 Other specified abnormal uterine and vaginal bleeding: Secondary | ICD-10-CM

## 2013-09-01 ENCOUNTER — Telehealth: Payer: Self-pay | Admitting: Family Medicine

## 2013-09-01 NOTE — Telephone Encounter (Signed)
Wants to get refill on inhalers but can not remember which one - please call

## 2013-09-02 ENCOUNTER — Telehealth: Payer: Self-pay | Admitting: Family Medicine

## 2013-09-02 NOTE — Telephone Encounter (Signed)
Never given this med to patient,  denied

## 2013-09-04 ENCOUNTER — Emergency Department (HOSPITAL_COMMUNITY)
Admission: EM | Admit: 2013-09-04 | Discharge: 2013-09-04 | Disposition: A | Discharge status: Home / Self Care | Payer: Medicare Other | Attending: Emergency Medicine | Admitting: Emergency Medicine

## 2013-09-04 ENCOUNTER — Encounter (HOSPITAL_COMMUNITY): Payer: Self-pay | Admitting: Emergency Medicine

## 2013-09-04 DIAGNOSIS — Z79899 Other long term (current) drug therapy: Secondary | ICD-10-CM | POA: Insufficient documentation

## 2013-09-04 DIAGNOSIS — L0291 Cutaneous abscess, unspecified: Secondary | ICD-10-CM

## 2013-09-04 DIAGNOSIS — Z794 Long term (current) use of insulin: Secondary | ICD-10-CM | POA: Insufficient documentation

## 2013-09-04 DIAGNOSIS — F17200 Nicotine dependence, unspecified, uncomplicated: Secondary | ICD-10-CM | POA: Insufficient documentation

## 2013-09-04 DIAGNOSIS — Z8659 Personal history of other mental and behavioral disorders: Secondary | ICD-10-CM | POA: Insufficient documentation

## 2013-09-04 DIAGNOSIS — E119 Type 2 diabetes mellitus without complications: Secondary | ICD-10-CM | POA: Insufficient documentation

## 2013-09-04 DIAGNOSIS — Z8739 Personal history of other diseases of the musculoskeletal system and connective tissue: Secondary | ICD-10-CM | POA: Insufficient documentation

## 2013-09-04 DIAGNOSIS — L02219 Cutaneous abscess of trunk, unspecified: Secondary | ICD-10-CM | POA: Insufficient documentation

## 2013-09-04 DIAGNOSIS — I10 Essential (primary) hypertension: Secondary | ICD-10-CM | POA: Insufficient documentation

## 2013-09-04 DIAGNOSIS — Z9851 Tubal ligation status: Secondary | ICD-10-CM | POA: Insufficient documentation

## 2013-09-04 DIAGNOSIS — Z9089 Acquired absence of other organs: Secondary | ICD-10-CM | POA: Insufficient documentation

## 2013-09-04 MED ORDER — LIDOCAINE HCL (PF) 2 % IJ SOLN: 10.0000 mL | Freq: Once | INTRAMUSCULAR | Status: DC

## 2013-09-04 MED ORDER — SULFAMETHOXAZOLE-TMP DS 800-160 MG PO TABS: 1.0000 | ORAL_TABLET | Freq: Two times a day (BID) | ORAL | Status: DC

## 2013-09-04 MED ORDER — CEPHALEXIN 500 MG PO CAPS: 500.0000 mg | ORAL_CAPSULE | Freq: Four times a day (QID) | ORAL | Status: DC

## 2013-09-04 MED ORDER — SULFAMETHOXAZOLE-TMP DS 800-160 MG PO TABS
1.0000 | ORAL_TABLET | Freq: Once | ORAL | Status: AC
  Administered 2013-09-04: 1 via ORAL
  Filled 2013-09-04: qty 1

## 2013-09-04 MED ORDER — LIDOCAINE HCL (PF) 1 % IJ SOLN
INTRAMUSCULAR | Status: AC
  Administered 2013-09-04: 13:00:00
  Filled 2013-09-04: qty 5

## 2013-09-04 MED ORDER — OXYCODONE-ACETAMINOPHEN 5-325 MG PO TABS
1.0000 | ORAL_TABLET | Freq: Once | ORAL | Status: AC
  Administered 2013-09-04: 1 via ORAL
  Filled 2013-09-04: qty 1

## 2013-09-04 MED ORDER — OXYCODONE-ACETAMINOPHEN 5-325 MG PO TABS: 1.0000 | ORAL_TABLET | ORAL | Status: DC | PRN

## 2013-09-04 NOTE — ED Notes (Signed)
Pt reports occas SOB at home, states she is out of her inhaler. Has called PCP for refill but has not received a call yet. Lung sounds clear to auscultation at present.

## 2013-09-04 NOTE — ED Provider Notes (Signed)
CSN: [id number]     Arrival date & time 09/04/13  1113 History   First MD Initiated Contact with Patient 09/04/13 1136     Chief Complaint  Patient presents with  . Abscess   (Consider location/radiation/quality/duration/timing/severity/associated sxs/prior Treatment) Patient is a 42 y.o. female presenting with abscess. The history is provided by the patient.  Abscess Location:  Torso Torso abscess location:  Abd RLQ Abscess quality: fluctuance, induration, painful and redness   Abscess quality: not draining   Red streaking: no   Duration:  1 week Progression:  Worsening Pain details:    Quality:  Throbbing and dull   Severity:  Moderate   Timing:  Constant   Progression:  Worsening Chronicity:  Recurrent Context: diabetes   Relieved by:  Nothing Worsened by:  Draining/squeezing and tar ointment Ineffective treatments:  Tar ointment, draining/squeezing and warm compresses Associated symptoms: no fever, no headaches, no nausea and no vomiting   Risk factors: prior abscess     Past Medical History  Diagnosis Date  . Disc disorder of cervical region   . Hypertension   . High cholesterol   . Mood disorder   . Diabetes mellitus    Past Surgical History  Procedure Laterality Date  . Cholecystectomy    . Tubal ligation     Family History  Problem Relation Age of Onset  . Diabetes Father   . Heart failure Father   . Stroke Father    History  Substance Use Topics  . Smoking status: Current Every Day Smoker    Types: Cigarettes  . Smokeless tobacco: Not on file  . Alcohol Use: No   OB History   Grav Para Term Preterm Abortions TAB SAB Ect Mult Living   4 4  4      4      Review of Systems  Constitutional: Negative for fever and chills.  Gastrointestinal: Negative for nausea and vomiting.  Musculoskeletal: Negative for arthralgias and joint swelling.  Skin: Positive for color change.       Abscess   Neurological: Negative for headaches.  Hematological:  Negative for adenopathy.  All other systems reviewed and are negative.    Allergies  Wheat; Soybean-containing drug products; Tape; Codeine; and Compazine  Home Medications   Current Outpatient Rx  Name  Route  Sig  Dispense  Refill  . budesonide-formoterol (SYMBICORT) 160-4.5 MCG/ACT inhaler   Inhalation   Inhale 1-2 puffs into the lungs 3 (three) times daily as needed (wheezing).         . cetirizine (ZYRTEC) 10 MG tablet   Oral   Take 1 tablet (10 mg total) by mouth daily.   30 tablet   6   . cyclobenzaprine (FLEXERIL) 10 MG tablet   Oral   Take 10 mg by mouth 4 (four) times daily.         . insulin glargine (LANTUS) 100 UNIT/ML injection   Subcutaneous   Inject 60 Units into the skin at bedtime.          . insulin lispro (HUMALOG) 100 UNIT/ML injection   Subcutaneous   Inject 16-22 Units into the skin 3 (three) times daily before meals. Per sliding scale. Pt will not exceed 15 units.         . metFORMIN (GLUCOPHAGE) 500 MG tablet   Oral   Take 500 mg by mouth 2 (two) times daily with a meal.         . oxyCODONE-acetaminophen (PERCOCET/ROXICET) 5-325 MG per tablet  Oral   Take 2 tablets by mouth every 4 (four) hours as needed for pain.   12 tablet   0   . pregabalin (LYRICA) 300 MG capsule   Oral   Take 300 mg by mouth 2 (two) times daily.         . Pseudoeph-Doxylamine-DM-APAP (NYQUIL PO)   Oral   Take 2 capsules by mouth daily as needed (cold).         . ARIPiprazole (ABILIFY) 5 MG tablet   Oral   Take 0.5 tablets (2.5 mg total) by mouth daily.   30 tablet   1   . glucose blood (CHOICE DM FORA G20 TEST STRIPS) test strip      Use as instructed   100 each   12   . venlafaxine XR (EFFEXOR-XR) 75 MG 24 hr capsule   Oral   Take 2 capsules (150 mg total) by mouth daily with breakfast.   60 capsule   1    BP 150/94  Pulse 103  Temp(Src) 98.2 F (36.8 C) (Oral)  Resp 20  Ht 5\' 4"  (1.626 m)  Wt 171 lb (77.565 kg)  BMI 29.34  kg/m2  SpO2 98%  LMP 08/15/2013 Physical Exam  Nursing note and vitals reviewed. Constitutional: She is oriented to person, place, and time. She appears well-developed and well-nourished. No distress.  HENT:  Head: Normocephalic and atraumatic.  Cardiovascular: Normal rate, regular rhythm and normal heart sounds.   No murmur heard. Pulmonary/Chest: Effort normal and breath sounds normal. No respiratory distress. She exhibits no tenderness.  Abdominal: Soft.  Neurological: She is alert and oriented to person, place, and time. She exhibits normal muscle tone. Coordination normal.  Skin: Skin is warm and dry. There is erythema.  Quarter sized area of erythema and fluctuance of the right lower abdominal wall.  Moderate surrounding erythema.  No red streaks.    ED Course  Procedures (including critical care time) Labs Review Labs Reviewed - No data to display Imaging Review No results found.  EKG Interpretation   None       MDM   INCISION AND DRAINAGE Performed by: Pauline Aus L. Consent: Verbal consent obtained. Risks and benefits: risks, benefits and alternatives were discussed Type: abscess  Body area: right lower abdomen  Anesthesia: local infiltration  Incision was made with a #11 scalpel.  Local anesthetic: lidocaine 1% w/o epinephrine  Anesthetic total: 3 ml  Complexity: complex Blunt dissection to break up loculations  Drainage: purulent  Drainage amount: moderate  Packing material: 1/4 in iodoform gauze  Patient tolerance: Patient tolerated the procedure well with no immediate complications.    Patient is well appearing.  No hx of vomiting, fever, or chills.  Hx of previous abscesses.  Agrees to keep area bandaged and return here in 2 days for recheck and packing removal.  Pt also agrees to warm compresses,  Bactrim, Keflex and percocet .    Raevon Broom L. Trisha Mangle, PA-C 09/05/13 1551

## 2013-09-04 NOTE — ED Notes (Signed)
Pt c/o abscess to R lower abd, onset 1 week ago.

## 2013-09-05 MED ORDER — BUDESONIDE-FORMOTEROL FUMARATE 160-4.5 MCG/ACT IN AERO: 2.0000 | INHALATION_SPRAY | Freq: Two times a day (BID) | RESPIRATORY_TRACT | Status: DC

## 2013-09-05 NOTE — Telephone Encounter (Signed)
Med refilled.

## 2013-09-05 NOTE — ED Provider Notes (Signed)
Medical screening examination/treatment/procedure(s) were performed by non-physician practitioner and as supervising physician I was immediately available for consultation/collaboration.  EKG Interpretation   None         Gwyneth Sprout, MD 09/05/13 [phone]

## 2013-09-06 ENCOUNTER — Encounter (HOSPITAL_COMMUNITY): Payer: Self-pay | Admitting: Emergency Medicine

## 2013-09-06 ENCOUNTER — Emergency Department (HOSPITAL_COMMUNITY)
Admission: EM | Admit: 2013-09-06 | Discharge: 2013-09-06 | Disposition: A | Discharge status: Home / Self Care | Payer: Medicare Other | Attending: Emergency Medicine | Admitting: Emergency Medicine

## 2013-09-06 DIAGNOSIS — F17200 Nicotine dependence, unspecified, uncomplicated: Secondary | ICD-10-CM | POA: Insufficient documentation

## 2013-09-06 DIAGNOSIS — Z8739 Personal history of other diseases of the musculoskeletal system and connective tissue: Secondary | ICD-10-CM | POA: Insufficient documentation

## 2013-09-06 DIAGNOSIS — E119 Type 2 diabetes mellitus without complications: Secondary | ICD-10-CM | POA: Insufficient documentation

## 2013-09-06 DIAGNOSIS — Z794 Long term (current) use of insulin: Secondary | ICD-10-CM | POA: Insufficient documentation

## 2013-09-06 DIAGNOSIS — Z79899 Other long term (current) drug therapy: Secondary | ICD-10-CM | POA: Insufficient documentation

## 2013-09-06 DIAGNOSIS — L02211 Cutaneous abscess of abdominal wall: Secondary | ICD-10-CM

## 2013-09-06 DIAGNOSIS — Z8659 Personal history of other mental and behavioral disorders: Secondary | ICD-10-CM | POA: Insufficient documentation

## 2013-09-06 DIAGNOSIS — L02219 Cutaneous abscess of trunk, unspecified: Secondary | ICD-10-CM | POA: Insufficient documentation

## 2013-09-06 DIAGNOSIS — Z792 Long term (current) use of antibiotics: Secondary | ICD-10-CM | POA: Insufficient documentation

## 2013-09-06 DIAGNOSIS — I10 Essential (primary) hypertension: Secondary | ICD-10-CM | POA: Insufficient documentation

## 2013-09-06 MED ORDER — LIDOCAINE HCL (PF) 1 % IJ SOLN
5.0000 mL | Freq: Once | INTRAMUSCULAR | Status: AC
  Administered 2013-09-06: 5 mL via INTRADERMAL
  Filled 2013-09-06: qty 5

## 2013-09-06 NOTE — ED Notes (Signed)
Pt here for recheck of I andD of abscess of abd. Cont to have pain , but  Her husband says it "looks better"

## 2013-09-06 NOTE — ED Notes (Signed)
Pt here for abscess wound recheck to right groin area. Nad.

## 2013-09-06 NOTE — ED Provider Notes (Signed)
CSN: [id number]     Arrival date & time 09/06/13  1116 History   First MD Initiated Contact with Patient 09/06/13 1122     Chief Complaint  Patient presents with  . Wound Check   (Consider location/radiation/quality/duration/timing/severity/associated sxs/prior Treatment) HPI Comments: Jaclyn Cruz is a 42 y.o. female who presents to the Emergency Department for recheck of an abscess that was I&D'd two days ago.  She states the wound remains sore, but is improving.  She reports continued drainage.  She denies fever, vomiting or chills.  States previous redness also improved  The history is provided by the patient.    Past Medical History  Diagnosis Date  . Disc disorder of cervical region   . Hypertension   . High cholesterol   . Mood disorder   . Diabetes mellitus    Past Surgical History  Procedure Laterality Date  . Cholecystectomy    . Tubal ligation     Family History  Problem Relation Age of Onset  . Diabetes Father   . Heart failure Father   . Stroke Father    History  Substance Use Topics  . Smoking status: Current Every Day Smoker    Types: Cigarettes  . Smokeless tobacco: Not on file  . Alcohol Use: No   OB History   Grav Para Term Preterm Abortions TAB SAB Ect Mult Living   4 4  4      4      Review of Systems  Constitutional: Negative for fever and chills.  Gastrointestinal: Negative for nausea and vomiting.  Musculoskeletal: Negative for arthralgias and joint swelling.  Skin: Positive for color change.       Abscess   Hematological: Negative for adenopathy.  All other systems reviewed and are negative.    Allergies  Wheat; Soybean-containing drug products; Tape; Codeine; and Compazine  Home Medications   Current Outpatient Rx  Name  Route  Sig  Dispense  Refill  . budesonide-formoterol (SYMBICORT) 160-4.5 MCG/ACT inhaler   Inhalation   Inhale 2 puffs into the lungs 2 (two) times daily.   1 Inhaler   3   . cephALEXin (KEFLEX) 500 MG  capsule   Oral   Take 1 capsule (500 mg total) by mouth 4 (four) times daily. For 10 days   40 capsule   0   . cetirizine (ZYRTEC) 10 MG tablet   Oral   Take 1 tablet (10 mg total) by mouth daily.   30 tablet   6   . cyclobenzaprine (FLEXERIL) 10 MG tablet   Oral   Take 10 mg by mouth 4 (four) times daily.         . insulin glargine (LANTUS) 100 UNIT/ML injection   Subcutaneous   Inject 60 Units into the skin at bedtime.          . insulin lispro (HUMALOG) 100 UNIT/ML injection   Subcutaneous   Inject 16-22 Units into the skin 3 (three) times daily before meals. Per sliding scale. Pt will not exceed 15 units.         . metFORMIN (GLUCOPHAGE) 500 MG tablet   Oral   Take 500 mg by mouth 2 (two) times daily with a meal.         . oxyCODONE-acetaminophen (PERCOCET/ROXICET) 5-325 MG per tablet   Oral   Take 1 tablet by mouth every 4 (four) hours as needed for severe pain.   20 tablet   0   . pregabalin (LYRICA) 300  MG capsule   Oral   Take 300 mg by mouth 2 (two) times daily.         . Pseudoeph-Doxylamine-DM-APAP (NYQUIL PO)   Oral   Take 2 capsules by mouth daily as needed (cold).         Marland Kitchen sulfamethoxazole-trimethoprim (BACTRIM DS) 800-160 MG per tablet   Oral   Take 1 tablet by mouth 2 (two) times daily. For 10 days   20 tablet   0   . ARIPiprazole (ABILIFY) 5 MG tablet   Oral   Take 0.5 tablets (2.5 mg total) by mouth daily.   30 tablet   1   . glucose blood (CHOICE DM FORA G20 TEST STRIPS) test strip      Use as instructed   100 each   12   . venlafaxine XR (EFFEXOR-XR) 75 MG 24 hr capsule   Oral   Take 2 capsules (150 mg total) by mouth daily with breakfast.   60 capsule   1    BP 132/83  Pulse 106  Temp(Src) 98.6 F (37 C) (Oral)  Resp 20  SpO2 96%  LMP 08/15/2013 Physical Exam  Nursing note and vitals reviewed. Constitutional: She is oriented to person, place, and time. She appears well-developed and well-nourished. No  distress.  HENT:  Head: Normocephalic and atraumatic.  Cardiovascular: Normal rate, regular rhythm and normal heart sounds.   No murmur heard. Pulmonary/Chest: Effort normal and breath sounds normal. No respiratory distress.  Abdominal: Soft. She exhibits no distension and no mass. There is no rebound and no guarding.  Abscess to the right lower abd wall with previous I&D.  Appears to be improving.  Mild to moderate drainage remains.    Musculoskeletal: Normal range of motion.  Neurological: She is alert and oriented to person, place, and time. She exhibits normal muscle tone. Coordination normal.  Skin: Skin is warm and dry. No rash noted.    ED Course  Procedures (including critical care time) Labs Review Labs Reviewed - No data to display Imaging Review No results found.  EKG Interpretation   None       MDM   Wound repacked Performed by: Lynzie Cliburn L. Consent: Verbal consent obtained. Risks and benefits: risks, benefits and alternatives were discussed Type: abscess  Body area: right lower abd Anesthesia: local infiltration   Local anesthetic: lidocaine 1% w/o epinephrine  Anesthetic total: 3 ml  Complexity: simple   Drainage: mild to moderate Drainage amount: small Packing material:  Incision was repacked with 1/4 in iodoform gauze  Patient tolerance: Patient tolerated the procedure well with no immediate complications.    Previous packing was removed by me.  Moderate drainage still remains.  Surounding erythema improved.  Abscess repacked and appears to be improving. Pt agrees to continue bactrim and keflex and return again in 2 days   Sharren Schnurr L. Clem Wisenbaker, PA-C 09/07/13 1213

## 2013-09-07 NOTE — ED Provider Notes (Signed)
Medical screening examination/treatment/procedure(s) were performed by non-physician practitioner and as supervising physician I was immediately available for consultation/collaboration.  EKG Interpretation   None         Khloi Rawl L Freada Twersky, MD 09/07/13 1541 

## 2013-09-27 ENCOUNTER — Encounter: Payer: Self-pay | Admitting: Obstetrics & Gynecology

## 2013-10-06 ENCOUNTER — Encounter (HOSPITAL_COMMUNITY): Payer: Self-pay | Admitting: Psychiatry

## 2013-10-06 ENCOUNTER — Ambulatory Visit (INDEPENDENT_AMBULATORY_CARE_PROVIDER_SITE_OTHER): Payer: Medicare Other | Admitting: Psychiatry

## 2013-10-06 VITALS — BP 140/88 | Ht 64.0 in | Wt 179.0 lb

## 2013-10-06 DIAGNOSIS — F3162 Bipolar disorder, current episode mixed, moderate: Secondary | ICD-10-CM

## 2013-10-06 MED ORDER — CLONAZEPAM 0.5 MG PO TABS: 0.5000 mg | ORAL_TABLET | Freq: Three times a day (TID) | ORAL | Status: DC
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
## 2013-10-17 ENCOUNTER — Ambulatory Visit (HOSPITAL_COMMUNITY): Payer: Self-pay | Admitting: Psychiatry

## 2013-10-24 ENCOUNTER — Encounter: Payer: Medicare Other | Admitting: Obstetrics & Gynecology

## 2013-10-24 ENCOUNTER — Telehealth (HOSPITAL_COMMUNITY): Payer: Self-pay | Admitting: *Deleted

## 2013-10-24 ENCOUNTER — Telehealth: Payer: Self-pay | Admitting: Family Medicine

## 2013-10-24 NOTE — Telephone Encounter (Signed)
Effexor XR is too expensive -Needs something else. Please call her and let her know

## 2013-10-25 NOTE — Telephone Encounter (Signed)
I would recommend she contact her psychiatrist, make sure she is on the generic.

## 2013-10-26 NOTE — Telephone Encounter (Signed)
lmtrc

## 2013-10-31 ENCOUNTER — Telehealth (HOSPITAL_COMMUNITY): Payer: Self-pay | Admitting: *Deleted

## 2013-10-31 ENCOUNTER — Other Ambulatory Visit (HOSPITAL_COMMUNITY): Payer: Self-pay | Admitting: Psychiatry

## 2013-10-31 DIAGNOSIS — F3130 Bipolar disorder, current episode depressed, mild or moderate severity, unspecified: Secondary | ICD-10-CM

## 2013-10-31 MED ORDER — SERTRALINE HCL 100 MG PO TABS: 100.0000 mg | ORAL_TABLET | Freq: Every day | ORAL | Status: DC

## 2013-10-31 NOTE — Telephone Encounter (Signed)
Will try zoloft 100 mg daily

## 2013-11-01 ENCOUNTER — Ambulatory Visit (HOSPITAL_COMMUNITY): Payer: Self-pay | Admitting: Psychiatry

## 2013-11-02 ENCOUNTER — Telehealth (HOSPITAL_COMMUNITY): Payer: Self-pay | Admitting: *Deleted

## 2013-11-02 ENCOUNTER — Ambulatory Visit (INDEPENDENT_AMBULATORY_CARE_PROVIDER_SITE_OTHER): Payer: Medicare Other | Admitting: Psychiatry

## 2013-11-02 ENCOUNTER — Encounter (HOSPITAL_COMMUNITY): Payer: Self-pay | Admitting: Psychiatry

## 2013-11-02 VITALS — BP 130/80 | Ht 64.0 in | Wt 182.0 lb

## 2013-11-02 DIAGNOSIS — F3130 Bipolar disorder, current episode depressed, mild or moderate severity, unspecified: Secondary | ICD-10-CM
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
## 2013-11-04 ENCOUNTER — Ambulatory Visit (HOSPITAL_COMMUNITY): Payer: Self-pay | Admitting: Psychiatry

## 2013-11-09 NOTE — Telephone Encounter (Signed)
No return call 

## 2013-11-10 ENCOUNTER — Ambulatory Visit (HOSPITAL_COMMUNITY): Payer: Self-pay | Admitting: Psychiatry

## 2013-11-20 ENCOUNTER — Emergency Department (HOSPITAL_COMMUNITY)
Admission: EM | Admit: 2013-11-20 | Discharge: 2013-11-20 | Disposition: A | Discharge status: Home / Self Care | Payer: Medicare Other | Attending: Emergency Medicine | Admitting: Emergency Medicine

## 2013-11-20 ENCOUNTER — Encounter (HOSPITAL_COMMUNITY): Payer: Self-pay | Admitting: Emergency Medicine

## 2013-11-20 DIAGNOSIS — L02219 Cutaneous abscess of trunk, unspecified: Secondary | ICD-10-CM | POA: Insufficient documentation

## 2013-11-20 DIAGNOSIS — L02214 Cutaneous abscess of groin: Secondary | ICD-10-CM

## 2013-11-20 DIAGNOSIS — F319 Bipolar disorder, unspecified: Secondary | ICD-10-CM | POA: Insufficient documentation

## 2013-11-20 DIAGNOSIS — F17200 Nicotine dependence, unspecified, uncomplicated: Secondary | ICD-10-CM | POA: Insufficient documentation

## 2013-11-20 DIAGNOSIS — Z8739 Personal history of other diseases of the musculoskeletal system and connective tissue: Secondary | ICD-10-CM | POA: Insufficient documentation

## 2013-11-20 DIAGNOSIS — L02211 Cutaneous abscess of abdominal wall: Secondary | ICD-10-CM

## 2013-11-20 DIAGNOSIS — F411 Generalized anxiety disorder: Secondary | ICD-10-CM | POA: Insufficient documentation

## 2013-11-20 DIAGNOSIS — Z794 Long term (current) use of insulin: Secondary | ICD-10-CM | POA: Insufficient documentation

## 2013-11-20 DIAGNOSIS — Z79899 Other long term (current) drug therapy: Secondary | ICD-10-CM | POA: Insufficient documentation

## 2013-11-20 DIAGNOSIS — I10 Essential (primary) hypertension: Secondary | ICD-10-CM | POA: Insufficient documentation

## 2013-11-20 DIAGNOSIS — L03319 Cellulitis of trunk, unspecified: Principal | ICD-10-CM

## 2013-11-20 DIAGNOSIS — E119 Type 2 diabetes mellitus without complications: Secondary | ICD-10-CM | POA: Insufficient documentation

## 2013-11-20 LAB — CBG MONITORING, ED: Glucose-Capillary: 267 mg/dL — ABNORMAL HIGH (ref 70–99)

## 2013-11-20 MED ORDER — CEPHALEXIN 500 MG PO CAPS: 500.0000 mg | ORAL_CAPSULE | Freq: Four times a day (QID) | ORAL | Status: DC

## 2013-11-20 MED ORDER — SULFAMETHOXAZOLE-TRIMETHOPRIM 800-160 MG PO TABS: 1.0000 | ORAL_TABLET | Freq: Two times a day (BID) | ORAL | Status: DC

## 2013-11-20 MED ORDER — IBUPROFEN 600 MG PO TABS: 600.0000 mg | ORAL_TABLET | Freq: Four times a day (QID) | ORAL | Status: DC | PRN

## 2013-11-20 MED ORDER — LIDOCAINE HCL (PF) 1 % IJ SOLN
INTRAMUSCULAR | Status: AC
  Administered 2013-11-20: 12:00:00
  Filled 2013-11-20: qty 5

## 2013-11-20 NOTE — ED Provider Notes (Signed)
Medical screening examination/treatment/procedure(s) were performed by non-physician practitioner and as supervising physician I was immediately available for consultation/collaboration.   EKG Interpretation None       Merilyn Pagan F Sedona Wenk, MD 11/20/13 1717 

## 2013-11-20 NOTE — ED Provider Notes (Signed)
CSN: [id number]     Arrival date & time 11/20/13  1105 History   First MD Initiated Contact with Patient 11/20/13 1125     Chief Complaint  Patient presents with  . Abscess     (Consider location/radiation/quality/duration/timing/severity/associated sxs/prior Treatment) HPI Comments: Patient is a 43 yo F PMHx significant for HTN, DM, Mood disorder, Bipolar disorder, anxiety presenting to the ED for two small abscesses that the patient noticed last evening. She denies any drainage from areas. She endorses mild discomfort to both areas. Patient has attempted warm compresses with no improvement. Denies fevers, chills, nausea, vomiting, diarrhea. Patient's blood sugars run in the 200-300s normally.   Patient is a 43 y.o. female presenting with abscess.  Abscess Associated symptoms: no fever, no nausea and no vomiting     Past Medical History  Diagnosis Date  . Disc disorder of cervical region   . Hypertension   . High cholesterol   . Mood disorder   . Diabetes mellitus   . Diabetes mellitus, type II   . Bipolar disorder   . Anxiety    Past Surgical History  Procedure Laterality Date  . Cholecystectomy    . Tubal ligation     Family History  Problem Relation Age of Onset  . Diabetes Father   . Heart failure Father   . Stroke Father   . Alcohol abuse Father   . Depression Mother   . Depression Brother   . Depression Son    History  Substance Use Topics  . Smoking status: Current Every Day Smoker    Types: Cigarettes  . Smokeless tobacco: Never Used  . Alcohol Use: No   OB History   Grav Para Term Preterm Abortions TAB SAB Ect Mult Living   4 4  4      4      Review of Systems  Constitutional: Negative for fever and chills.  Gastrointestinal: Negative for nausea, vomiting and diarrhea.  Skin:       Abscess  All other systems reviewed and are negative.      Allergies  Wheat; Soybean-containing drug products; Tape; Codeine; and Compazine  Home Medications    Current Outpatient Rx  Name  Route  Sig  Dispense  Refill  . budesonide-formoterol (SYMBICORT) 160-4.5 MCG/ACT inhaler   Inhalation   Inhale 2 puffs into the lungs 2 (two) times daily.   1 Inhaler   3   . cephALEXin (KEFLEX) 500 MG capsule   Oral   Take 1 capsule (500 mg total) by mouth 4 (four) times daily. For 10 days   40 capsule   0   . cephALEXin (KEFLEX) 500 MG capsule   Oral   Take 1 capsule (500 mg total) by mouth 4 (four) times daily.   40 capsule   0   . cetirizine (ZYRTEC) 10 MG tablet   Oral   Take 1 tablet (10 mg total) by mouth daily.   30 tablet   6   . clonazePAM (KLONOPIN) 0.5 MG tablet   Oral   Take 1 tablet (0.5 mg total) by mouth 3 (three) times daily.   90 tablet   2   . cyclobenzaprine (FLEXERIL) 10 MG tablet   Oral   Take 10 mg by mouth 4 (four) times daily.         Marland Kitchen. glucose blood (CHOICE DM FORA G20 TEST STRIPS) test strip      Use as instructed   100 each   12   .  ibuprofen (ADVIL,MOTRIN) 600 MG tablet   Oral   Take 1 tablet (600 mg total) by mouth every 6 (six) hours as needed.   30 tablet   0   . insulin glargine (LANTUS) 100 UNIT/ML injection   Subcutaneous   Inject 60 Units into the skin at bedtime.          . insulin lispro (HUMALOG) 100 UNIT/ML injection   Subcutaneous   Inject 16-22 Units into the skin 3 (three) times daily before meals. Per sliding scale. Pt will not exceed 15 units.         . metFORMIN (GLUCOPHAGE) 500 MG tablet   Oral   Take 500 mg by mouth 2 (two) times daily with a meal.         . oxyCODONE-acetaminophen (PERCOCET/ROXICET) 5-325 MG per tablet   Oral   Take 1 tablet by mouth every 4 (four) hours as needed for severe pain.   20 tablet   0   . pregabalin (LYRICA) 300 MG capsule   Oral   Take 300 mg by mouth 2 (two) times daily.         . Pseudoeph-Doxylamine-DM-APAP (NYQUIL PO)   Oral   Take 2 capsules by mouth daily as needed (cold).         . sertraline (ZOLOFT) 100 MG  tablet   Oral   Take 1 tablet (100 mg total) by mouth daily.   30 tablet   2   . sulfamethoxazole-trimethoprim (BACTRIM DS) 800-160 MG per tablet   Oral   Take 1 tablet by mouth 2 (two) times daily. For 10 days   20 tablet   0   . sulfamethoxazole-trimethoprim (SEPTRA DS) 800-160 MG per tablet   Oral   Take 1 tablet by mouth every 12 (twelve) hours.   20 tablet   0    BP 123/84  Pulse 92  Temp(Src) 98 F (36.7 C) (Oral)  Resp 16  Ht 5\' 4"  (1.626 m)  Wt 180 lb (81.647 kg)  BMI 30.88 kg/m2  SpO2 98%  LMP 10/12/2013 Physical Exam  Constitutional: She is oriented to person, place, and time. She appears well-developed and well-nourished. No distress.  HENT:  Head: Normocephalic and atraumatic.  Right Ear: External ear normal.  Left Ear: External ear normal.  Nose: Nose normal.  Mouth/Throat: Oropharynx is clear and moist.  Eyes: Conjunctivae are normal.  Neck: Normal range of motion. Neck supple.  Cardiovascular: Normal rate.   Pulmonary/Chest: Effort normal.  Abdominal: Soft.    Small 0.5 cm fluctuant area with erythema noted on graphical documentation. No induration or drainage. TTP.   Musculoskeletal: Normal range of motion.  Neurological: She is alert and oriented to person, place, and time.  Skin: Skin is warm and dry. No rash noted. She is not diaphoretic.     Small 0.5 cm fluctuant erythematous area noted on graphical documentation. No induration or drainage. TTP.   Psychiatric: She has a normal mood and affect.    ED Course  Procedures (including critical care time) Medications  lidocaine (PF) (XYLOCAINE) 1 % injection (  Given by Other 11/20/13 1155)    Labs Review Labs Reviewed  CBG MONITORING, ED - Abnormal; Notable for the following:    Glucose-Capillary 267 (*)    All other components within normal limits   INCISION AND DRAINAGE Performed by: Francee Piccolo L Consent: Verbal consent obtained. Risks and benefits: risks, benefits and  alternatives were discussed Type: abscess  Body area: lower abdominal  wall, left groin  Anesthesia: local infiltration  Incision was made with needle.  Local anesthetic: lidocaine 1% w/o epinephrine  Anesthetic total: 5 ml  Complexity: complex Blunt dissection to break up loculations  Drainage: purulent  Drainage amount: minimal  Packing material: NA  Patient tolerance: Patient tolerated the procedure well with no immediate complications.    Imaging Review No results found.   EKG Interpretation None      MDM   Final diagnoses:  Abscess of abdominal wall  Abscess of groin, left    Filed Vitals:   11/20/13 1111  BP: 123/84  Pulse: 92  Temp: 98 F (36.7 C)  Resp: 16   Afebrile, NAD, non-toxic appearing, AAOx4.   Elevated glucose noted, in patient's normal range. Advised follow up with PCP regarding elevated glucose levels.  Patient with skin abscess amenable to incision and drainage.  Abscess was not large enough to warrant packing,  wound recheck in 2 days with PCP. Encouraged home warm soaks and flushing.  Mild signs of cellulitis is surrounding skin.  Will d/c to home.  Given DM will prescribe antibiotics. Will prescribe motrin for discomfort.      Jeannetta Ellis, PA-C 11/20/13 1558

## 2013-11-20 NOTE — ED Notes (Signed)
Patient reports 2 small abscess-per patient one in pelvic region and one in left groin at "pantyline." Per patient no drainage. Patient reports using warm compresses with no results.

## 2013-11-20 NOTE — ED Notes (Signed)
Pt presents with abscess-like areas to right lower abdomen and pelvic area. Pt first noticed symptoms yesterday. Pt denies drainage. Areas are tender to touch.

## 2013-11-20 NOTE — Discharge Instructions (Signed)
Please follow up with your primary care physician in 1-2 days. If you do not have one please call the [facility] number listed above. Please keep those areas clean dry and covered. Please continue to use the warm compresses. Please take Motrin for pain or discomfort. Please take your antibiotic until completion. Please read all discharge instructions and return precautions.    Abscess An abscess is an infected area that contains a collection of pus and debris.It can occur in almost any part of the body. An abscess is also known as a furuncle or boil. CAUSES  An abscess occurs when tissue gets infected. This can occur from blockage of oil or sweat glands, infection of hair follicles, or a minor injury to the skin. As the body tries to fight the infection, pus collects in the area and creates pressure under the skin. This pressure causes pain. People with weakened immune systems have difficulty fighting infections and get certain abscesses more often.  SYMPTOMS Usually an abscess develops on the skin and becomes a painful mass that is red, warm, and tender. If the abscess forms under the skin, you may feel a moveable soft area under the skin. Some abscesses break open (rupture) on their own, but most will continue to get worse without care. The infection can spread deeper into the body and eventually into the bloodstream, causing you to feel ill.  DIAGNOSIS  Your caregiver will take your medical history and perform a physical exam. A sample of fluid may also be taken from the abscess to determine what is causing your infection. TREATMENT  Your caregiver may prescribe antibiotic medicines to fight the infection. However, taking antibiotics alone usually does not cure an abscess. Your caregiver may need to make a small cut (incision) in the abscess to drain the pus. In some cases, gauze is packed into the abscess to reduce pain and to continue draining the area. HOME CARE INSTRUCTIONS     Only take over-the-counter or prescription medicines for pain, discomfort, or fever as directed by your caregiver.  If you were prescribed antibiotics, take them as directed. Finish them even if you start to feel better.  If gauze is used, follow your caregiver's directions for changing the gauze.  To avoid spreading the infection:  Keep your draining abscess covered with a bandage.  Wash your hands well.  Do not share personal care items, towels, or whirlpools with others.  Avoid skin contact with others.  Keep your skin and clothes clean around the abscess.  Keep all follow-up appointments as directed by your caregiver. SEEK MEDICAL CARE IF:   You have increased pain, swelling, redness, fluid drainage, or bleeding.  You have muscle aches, chills, or a general ill feeling.  You have a fever. MAKE SURE YOU:   Understand these instructions.  Will watch your condition.  Will get help right away if you are not doing well or get worse. Document Released: 06/11/2005 Document Revised: 03/02/2012 Document Reviewed: 11/14/2011 [facility] Patient Information 2014 [employer] [place].

## 2013-11-28 ENCOUNTER — Telehealth: Payer: Self-pay | Admitting: *Deleted

## 2013-11-28 ENCOUNTER — Other Ambulatory Visit: Payer: Self-pay | Admitting: Family Medicine

## 2013-11-28 MED ORDER — FLUCONAZOLE 150 MG PO TABS: 150.0000 mg | ORAL_TABLET | Freq: Once | ORAL | Status: DC

## 2013-11-28 NOTE — Telephone Encounter (Signed)
ok 

## 2013-11-28 NOTE — Telephone Encounter (Signed)
Received call from patient.   Reported that she noted to have boil x2 under abdomen and on leg.   States that she was seen in ER where ABT x2 was started.   States that she is beginning to have S/Sx of yeast infection (itching and discharge).   Requested MD to call in medication for yeast infection.

## 2013-11-29 MED ORDER — FLUCONAZOLE 150 MG PO TABS
150.0000 mg | ORAL_TABLET | Freq: Once | ORAL | Status: DC
Start: 2013-11-29 — End: 2014-05-17

## 2013-11-29 NOTE — Telephone Encounter (Signed)
Call placed to patient and patient made aware.   Prescription sent to pharmacy.  

## 2013-11-30 ENCOUNTER — Encounter (HOSPITAL_COMMUNITY): Payer: Self-pay | Admitting: Psychiatry

## 2013-11-30 ENCOUNTER — Ambulatory Visit (INDEPENDENT_AMBULATORY_CARE_PROVIDER_SITE_OTHER): Payer: Medicare Other | Admitting: Psychiatry

## 2013-11-30 VITALS — BP 100/70 | Ht 64.0 in | Wt 184.0 lb

## 2013-11-30 DIAGNOSIS — F411 Generalized anxiety disorder: Secondary | ICD-10-CM

## 2013-11-30 DIAGNOSIS — F3130 Bipolar disorder, current episode depressed, mild or moderate severity, unspecified: Secondary | ICD-10-CM

## 2013-11-30 DIAGNOSIS — F3160 Bipolar disorder, current episode mixed, unspecified: Secondary | ICD-10-CM

## 2013-11-30 MED ORDER — CLONAZEPAM 1 MG PO TABS: 1.0000 mg | ORAL_TABLET | Freq: Three times a day (TID) | ORAL | Status: DC

## 2013-11-30 MED ORDER — VENLAFAXINE HCL 75 MG PO TABS: 75.0000 mg | ORAL_TABLET | Freq: Three times a day (TID) | ORAL | Status: DC
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
## 2013-12-19 ENCOUNTER — Ambulatory Visit (HOSPITAL_COMMUNITY): Payer: Self-pay | Admitting: Psychiatry

## 2013-12-21 ENCOUNTER — Ambulatory Visit (HOSPITAL_COMMUNITY): Payer: Self-pay | Admitting: Psychiatry

## 2013-12-29 ENCOUNTER — Encounter (HOSPITAL_COMMUNITY): Payer: Self-pay | Admitting: Psychiatry

## 2013-12-29 ENCOUNTER — Ambulatory Visit (INDEPENDENT_AMBULATORY_CARE_PROVIDER_SITE_OTHER): Payer: Medicare Other | Admitting: Psychiatry

## 2013-12-29 VITALS — BP 130/80 | Ht 64.0 in | Wt 174.0 lb

## 2013-12-29 DIAGNOSIS — F3160 Bipolar disorder, current episode mixed, unspecified: Secondary | ICD-10-CM

## 2013-12-29 DIAGNOSIS — F411 Generalized anxiety disorder: Secondary | ICD-10-CM

## 2013-12-29 DIAGNOSIS — F3130 Bipolar disorder, current episode depressed, mild or moderate severity, unspecified: Secondary | ICD-10-CM

## 2013-12-29 MED ORDER — VENLAFAXINE HCL 75 MG PO TABS: 75.0000 mg | ORAL_TABLET | Freq: Three times a day (TID) | ORAL | Status: DC

## 2013-12-29 MED ORDER — LAMOTRIGINE 25 MG PO TABS: ORAL_TABLET | ORAL | Status: DC

## 2013-12-29 MED ORDER — LURASIDONE HCL 20 MG PO TABS: 20.0000 mg | ORAL_TABLET | Freq: Every day | ORAL | Status: DC
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
## 2014-01-16 ENCOUNTER — Ambulatory Visit (HOSPITAL_COMMUNITY): Payer: Self-pay | Admitting: Psychiatry

## 2014-01-18 IMAGING — CR DG LUMBAR SPINE COMPLETE 4+V
5 series · 5 of 5 positions shown · non-contrast
Comparison: 11/07/2012

CLINICAL DATA: Low back pain

LUMBAR SPINE - COMPLETE 4+ VIEW

[view not recorded (1 of 5)]
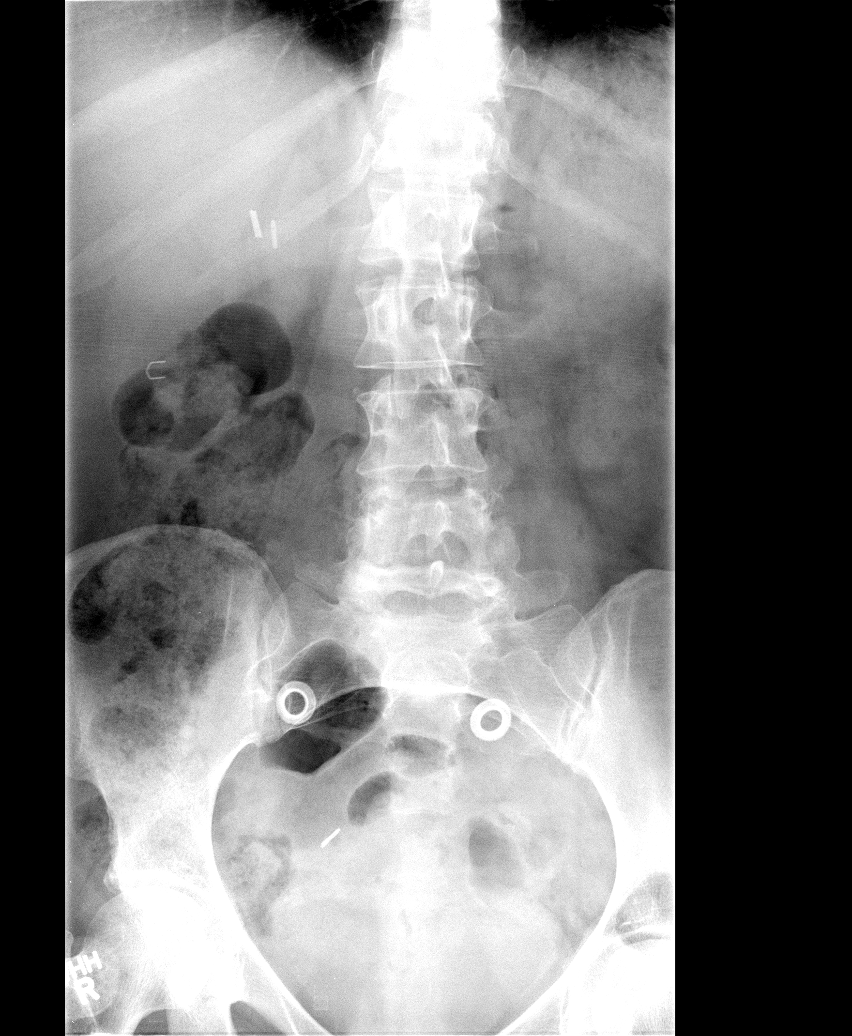

[view not recorded (2 of 5)]
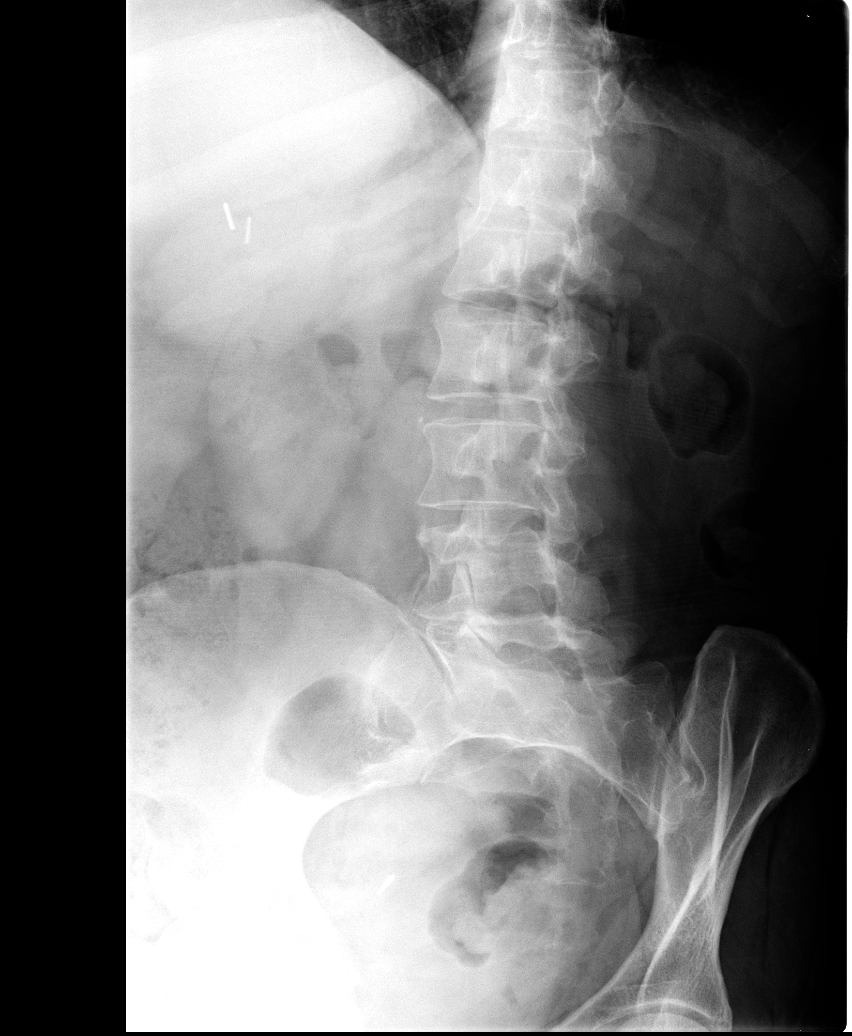

[view not recorded (3 of 5)]
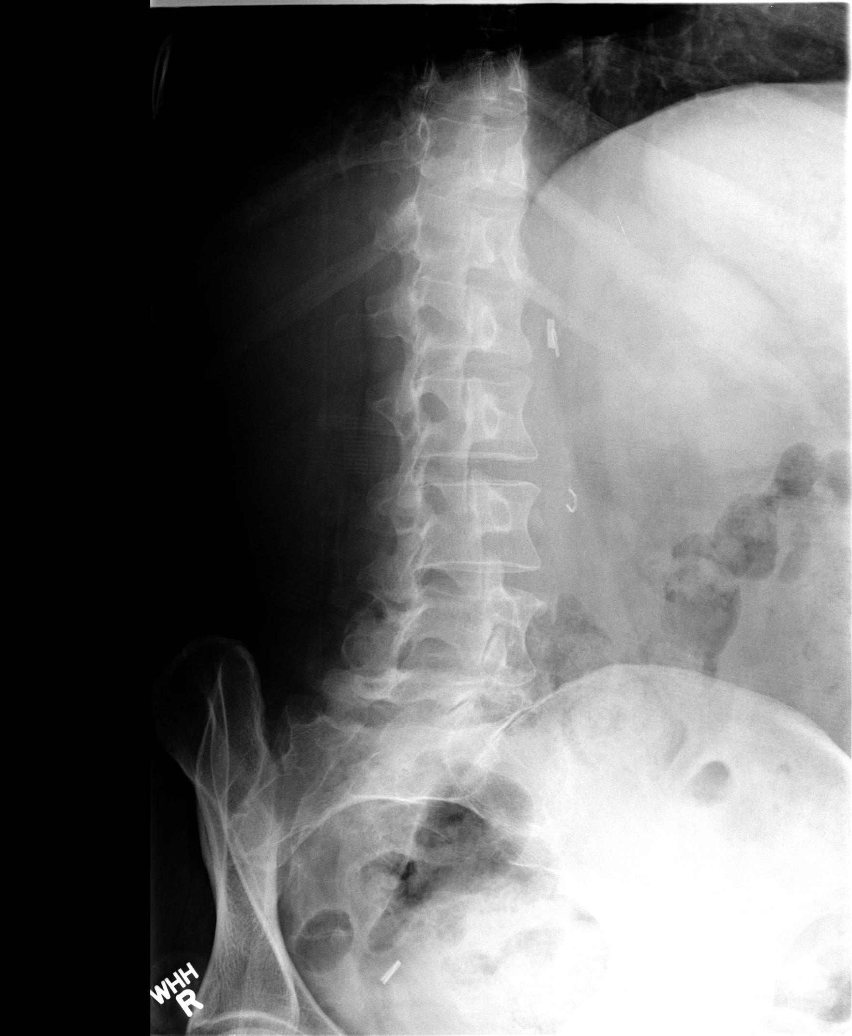

[view not recorded (4 of 5)]
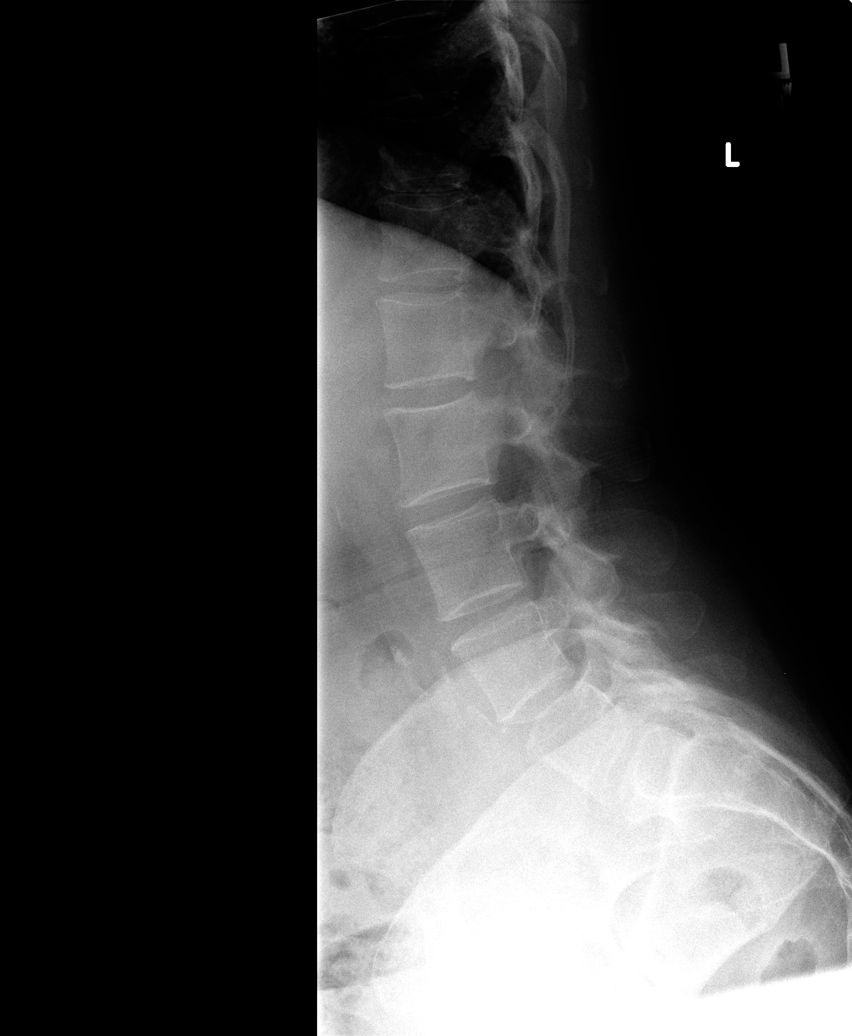

[view not recorded (5 of 5)]
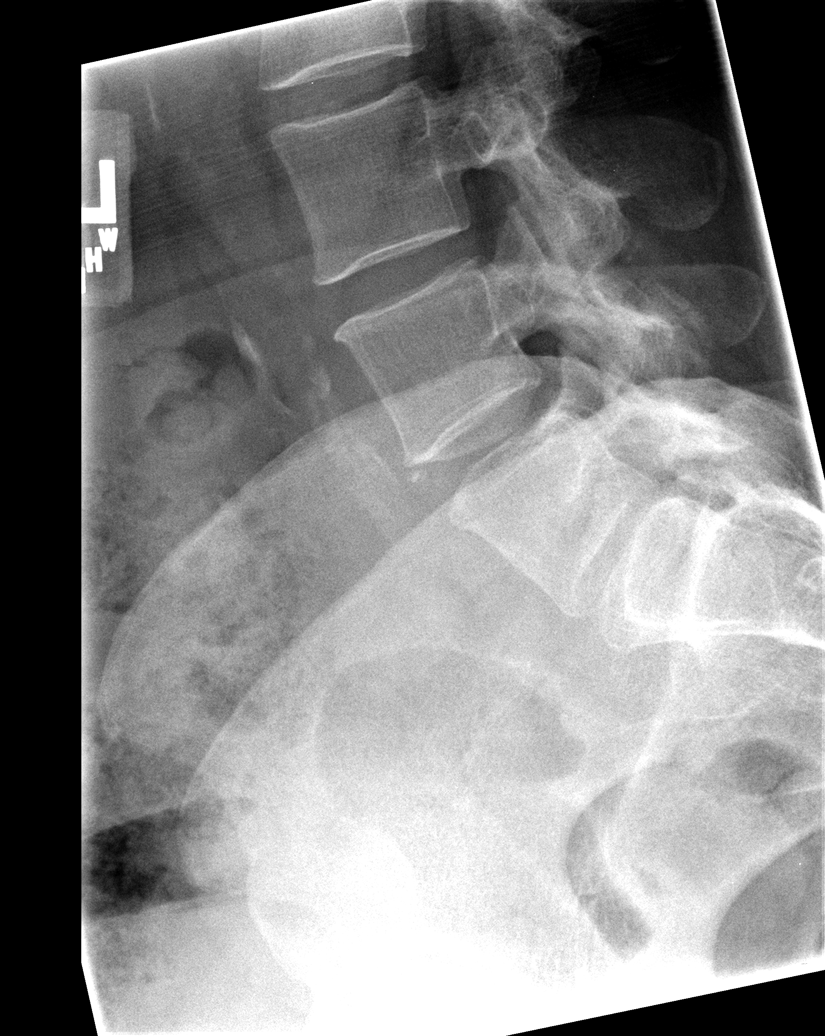

[5 of 5 positions shown; findings below may reference images not displayed]

FINDINGS: Vertebral body height is well-maintained.  No acute
fractures are seen.  No significant disc pathology is noted.  No
spondylolysis or spondylolisthesis is seen.  Mild facet
hypertrophic changes are noted at L5 S1.
IMPRESSION: Mild degenerative change without acute abnormality.

## 2014-01-24 ENCOUNTER — Telehealth (HOSPITAL_COMMUNITY): Payer: Self-pay | Admitting: *Deleted

## 2014-01-26 ENCOUNTER — Ambulatory Visit (HOSPITAL_COMMUNITY): Payer: Self-pay | Admitting: Psychiatry

## 2014-01-27 ENCOUNTER — Telehealth: Payer: Self-pay | Admitting: Family Medicine

## 2014-01-27 NOTE — Telephone Encounter (Signed)
Error

## 2014-03-21 ENCOUNTER — Encounter (HOSPITAL_COMMUNITY): Payer: Self-pay | Admitting: Psychiatry

## 2014-03-21 ENCOUNTER — Ambulatory Visit (HOSPITAL_COMMUNITY): Payer: Self-pay | Admitting: Psychiatry

## 2014-04-07 ENCOUNTER — Other Ambulatory Visit (HOSPITAL_COMMUNITY): Payer: Self-pay | Admitting: Psychiatry

## 2014-04-07 ENCOUNTER — Telehealth (HOSPITAL_COMMUNITY): Payer: Self-pay | Admitting: *Deleted

## 2014-04-07 NOTE — Telephone Encounter (Signed)
Declined, patient has missed several appointments

## 2014-04-18 ENCOUNTER — Telehealth (HOSPITAL_COMMUNITY): Payer: Self-pay | Admitting: *Deleted

## 2014-04-18 NOTE — Telephone Encounter (Signed)
Declined, patient has missed too many appointments

## 2014-04-28 ENCOUNTER — Emergency Department (HOSPITAL_COMMUNITY)
Admission: EM | Admit: 2014-04-28 | Discharge: 2014-04-28 | Disposition: A | Discharge status: Home / Self Care | Payer: Medicare Other | Attending: Emergency Medicine | Admitting: Emergency Medicine

## 2014-04-28 ENCOUNTER — Encounter (HOSPITAL_COMMUNITY): Payer: Self-pay | Admitting: Emergency Medicine

## 2014-04-28 DIAGNOSIS — J029 Acute pharyngitis, unspecified: Secondary | ICD-10-CM | POA: Diagnosis not present

## 2014-04-28 DIAGNOSIS — IMO0002 Reserved for concepts with insufficient information to code with codable children: Secondary | ICD-10-CM | POA: Diagnosis not present

## 2014-04-28 DIAGNOSIS — F319 Bipolar disorder, unspecified: Secondary | ICD-10-CM | POA: Diagnosis not present

## 2014-04-28 DIAGNOSIS — Z79899 Other long term (current) drug therapy: Secondary | ICD-10-CM | POA: Insufficient documentation

## 2014-04-28 DIAGNOSIS — M79604 Pain in right leg: Secondary | ICD-10-CM

## 2014-04-28 DIAGNOSIS — Z794 Long term (current) use of insulin: Secondary | ICD-10-CM | POA: Insufficient documentation

## 2014-04-28 DIAGNOSIS — I10 Essential (primary) hypertension: Secondary | ICD-10-CM | POA: Insufficient documentation

## 2014-04-28 DIAGNOSIS — F17200 Nicotine dependence, unspecified, uncomplicated: Secondary | ICD-10-CM | POA: Insufficient documentation

## 2014-04-28 DIAGNOSIS — E119 Type 2 diabetes mellitus without complications: Secondary | ICD-10-CM | POA: Insufficient documentation

## 2014-04-28 DIAGNOSIS — M79609 Pain in unspecified limb: Secondary | ICD-10-CM | POA: Diagnosis not present

## 2014-04-28 DIAGNOSIS — M79605 Pain in left leg: Secondary | ICD-10-CM

## 2014-04-28 DIAGNOSIS — Z791 Long term (current) use of non-steroidal anti-inflammatories (NSAID): Secondary | ICD-10-CM | POA: Diagnosis not present

## 2014-04-28 DIAGNOSIS — F411 Generalized anxiety disorder: Secondary | ICD-10-CM | POA: Insufficient documentation

## 2014-04-28 MED ORDER — CYCLOBENZAPRINE HCL 10 MG PO TABS: 10.0000 mg | ORAL_TABLET | Freq: Two times a day (BID) | ORAL | Status: DC | PRN

## 2014-04-28 MED ORDER — ACETAMINOPHEN 500 MG PO TABS
1000.0000 mg | ORAL_TABLET | Freq: Once | ORAL | Status: AC
  Administered 2014-04-28: 1000 mg via ORAL
  Filled 2014-04-28: qty 2

## 2014-04-28 MED ORDER — CYCLOBENZAPRINE HCL 10 MG PO TABS
10.0000 mg | ORAL_TABLET | Freq: Once | ORAL | Status: AC
  Administered 2014-04-28: 10 mg via ORAL
  Filled 2014-04-28: qty 1

## 2014-04-28 NOTE — Discharge Instructions (Signed)
For pain control you may take up to 800mg  of ibuprofen (that is usually 4 over the counter pills)  3 times a day (take with food) and acetaminophen 975mg  (this is 3 over the counter pills) four times a day. Do not drink alcohol or combine with other medications that have acetaminophen as an ingredient (Read the labels!).    For breakthrough pain you may take Flexeril. Do not drink alcohol, drive or operate heavy machinery when taking Flexeril.  Please follow with your primary care doctor in the next 2 days for a check-up. They must obtain records for further management.   Do not hesitate to return to the Emergency Department for any new, worsening or concerning symptoms.   Pharyngitis Pharyngitis is redness, pain, and swelling (inflammation) of your pharynx.  CAUSES  Pharyngitis is usually caused by infection. Most of the time, these infections are from viruses (viral) and are part of a cold. However, sometimes pharyngitis is caused by bacteria (bacterial). Pharyngitis can also be caused by allergies. Viral pharyngitis may be spread from person to person by coughing, sneezing, and personal items or utensils (cups, forks, spoons, toothbrushes). Bacterial pharyngitis may be spread from person to person by more intimate contact, such as kissing.  SIGNS AND SYMPTOMS  Symptoms of pharyngitis include:   Sore throat.   Tiredness (fatigue).   Low-grade fever.   Headache.  Joint pain and muscle aches.  Skin rashes.  Swollen lymph nodes.  Plaque-like film on throat or tonsils (often seen with bacterial pharyngitis). DIAGNOSIS  Your health care provider will ask you questions about your illness and your symptoms. Your medical history, along with a physical exam, is often all that is needed to diagnose pharyngitis. Sometimes, a rapid strep test is done. Other lab tests may also be done, depending on the suspected cause.  TREATMENT  Viral pharyngitis will usually get better in 3-4 days  without the use of medicine. Bacterial pharyngitis is treated with medicines that kill germs (antibiotics).  HOME CARE INSTRUCTIONS   Drink enough water and fluids to keep your urine clear or pale yellow.   Only take over-the-counter or prescription medicines as directed by your health care provider:   If you are prescribed antibiotics, make sure you finish them even if you start to feel better.   Do not take aspirin.   Get lots of rest.   Gargle with 8 oz of salt water ( tsp of salt per 1 qt of water) as often as every 1-2 hours to soothe your throat.   Throat lozenges (if you are not at risk for choking) or sprays may be used to soothe your throat. SEEK MEDICAL CARE IF:   You have large, tender lumps in your neck.  You have a rash.  You cough up green, yellow-brown, or bloody spit. SEEK IMMEDIATE MEDICAL CARE IF:   Your neck becomes stiff.  You drool or are unable to swallow liquids.  You vomit or are unable to keep medicines or liquids down.  You have severe pain that does not go away with the use of recommended medicines.  You have trouble breathing (not caused by a stuffy nose). MAKE SURE YOU:   Understand these instructions.  Will watch your condition.  Will get help right away if you are not doing well or get worse. Document Released: 09/01/2005 Document Revised: 06/22/2013 Document Reviewed: 05/09/2013 [facility] Patient Information 2015 [employer], [place]. This information is not intended to replace advice given to you by your health care  provider. Make sure you discuss any questions you have with your health care provider. ° °

## 2014-04-28 NOTE — ED Provider Notes (Signed)
CSN: [id number]     Arrival date & time 04/28/14  1652 History   First MD Initiated Contact with Patient 04/28/14 1744     Chief Complaint  Patient presents with  . Sore Throat  . Extremity Pain     (Consider location/radiation/quality/duration/timing/severity/associated sxs/prior Treatment) HPI  Jaclyn Cruz is a 43 y.o. female complaining of severe bilateral lower extremity pain from the toes to the hips onset 1 month ago, this is atraumatic. Pain is rated at 10 out of 10, patient can accomplish her ADLs but states that walking exacerbates the pain. Taking Motrin at home with little relief. States that the pain is worse on the dorsum of the bilateral feet. Patient states that she does not jog or walk regularly. This is not appear to be repetitive use. Denies weakness, numbness, paresthesia. Patient also reports a sore throat, exacerbated with swallowing which she noticed this morning. She denies cough, fever, chills, shortness of breath, chest pain, abdominal pain, change in bowel or bladder habits.  No PCP: States she was discharged for a non-payment.  Past Medical History  Diagnosis Date  . Disc disorder of cervical region   . Hypertension   . High cholesterol   . Mood disorder   . Diabetes mellitus   . Diabetes mellitus, type II   . Bipolar disorder   . Anxiety    Past Surgical History  Procedure Laterality Date  . Cholecystectomy    . Tubal ligation     Family History  Problem Relation Age of Onset  . Diabetes Father   . Heart failure Father   . Stroke Father   . Alcohol abuse Father   . Depression Mother   . Depression Brother   . Depression Son    History  Substance Use Topics  . Smoking status: Current Every Day Smoker    Types: Cigarettes  . Smokeless tobacco: Never Used  . Alcohol Use: No   OB History   Grav Para Term Preterm Abortions TAB SAB Ect Mult Living   4 4  4      4      Review of Systems  10 systems reviewed and found to be negative,  except as noted in the HPI.   Allergies  Wheat; Soybean-containing drug products; Tape; Codeine; and Compazine  Home Medications   Prior to Admission medications   Medication Sig Start Date End Date Taking? Authorizing Provider  budesonide-formoterol (SYMBICORT) 160-4.5 MCG/ACT inhaler Inhale 2 puffs into the lungs 2 (two) times daily. 09/05/13   Donita Brooks, MD  cetirizine (ZYRTEC) 10 MG tablet Take 1 tablet (10 mg total) by mouth daily. 08/17/13   Salley Scarlet, MD  clonazePAM (KLONOPIN) 1 MG tablet Take 1 tablet (1 mg total) by mouth 3 (three) times daily. 11/30/13 11/30/14  Diannia Ruder, MD  cyclobenzaprine (FLEXERIL) 10 MG tablet Take 10 mg by mouth 4 (four) times daily.    Historical Provider, MD  cyclobenzaprine (FLEXERIL) 10 MG tablet Take 1 tablet (10 mg total) by mouth 2 (two) times daily as needed for muscle spasms. 04/28/14   Lacretia Tindall, PA-C  fluconazole (DIFLUCAN) 150 MG tablet Take 1 tablet (150 mg total) by mouth once. 11/29/13   Donita Brooks, MD  glucose blood (CHOICE DM FORA G20 TEST STRIPS) test strip Use as instructed 08/12/13   Donita Brooks, MD  ibuprofen (ADVIL,MOTRIN) 600 MG tablet Take 1 tablet (600 mg total) by mouth every 6 (six) hours as needed. 11/20/13  Jennifer L Piepenbrink, PA-C  insulin glargine (LANTUS) 100 UNIT/ML injection Inject 60 Units into the skin at bedtime.     Historical Provider, MD  insulin lispro (HUMALOG) 100 UNIT/ML injection Inject 16-22 Units into the skin 3 (three) times daily before meals. Per sliding scale. Pt will not exceed 15 units.    Historical Provider, MD  lamoTRIgine (LAMICTAL) 25 MG tablet Start with one tablet at bedtime for one week. Add another tablet each week until up to 4 a day 12/29/13   Diannia Ruder, MD  Lurasidone HCl (LATUDA) 20 MG TABS Take 1 tablet (20 mg total) by mouth daily. 12/29/13   Diannia Ruder, MD  metFORMIN (GLUCOPHAGE) 500 MG tablet Take 500 mg by mouth 2 (two) times daily with a meal.     Historical Provider, MD  oxyCODONE-acetaminophen (PERCOCET/ROXICET) 5-325 MG per tablet Take 1 tablet by mouth every 4 (four) hours as needed for severe pain. 09/04/13   Tammy L. Triplett, PA-C  pregabalin (LYRICA) 300 MG capsule Take 300 mg by mouth 2 (two) times daily.    Historical Provider, MD  venlafaxine (EFFEXOR) 75 MG tablet Take 1 tablet (75 mg total) by mouth 3 (three) times daily with meals. 12/29/13   Diannia Ruder, MD   BP 149/87  Pulse 90  Temp(Src) 98.5 F (36.9 C) (Oral)  Resp 20  Ht 5\' 4"  (1.626 m)  Wt 180 lb (81.647 kg)  BMI 30.88 kg/m2  SpO2 99%  LMP 04/02/2014 Physical Exam  Nursing note and vitals reviewed. Constitutional: She is oriented to person, place, and time. She appears well-developed and well-nourished. No distress.  tearful  HENT:  Head: Normocephalic and atraumatic.  Mouth/Throat: Oropharynx is clear and moist.  No drooling or stridor. Posterior pharynx mildly erythematous no significant tonsillar hypertrophy. No exudate. Soft palate rises symmetrically. No TTP or induration under tongue.   No tenderness to palpation of frontal or bilateral maxillary sinuses.  No mucosal edema in the nares.  Bilateral tympanic membranes with normal architecture and good light reflex.    Eyes: Conjunctivae and EOM are normal. Pupils are equal, round, and reactive to light.  Neck: Normal range of motion.  Cardiovascular: Normal rate, regular rhythm and intact distal pulses.   Pulmonary/Chest: Effort normal and breath sounds normal. No stridor. No respiratory distress. She has no wheezes. She has no rales. She exhibits no tenderness.  Musculoskeletal: Normal range of motion. She exhibits tenderness. She exhibits no edema.  Agent is diffusely, exquisitely tender to palpation from the hips to the tips of the toes bilaterally. There is no focal tenderness. She is neurovascularly intact with excellent range of motion to hips knees and ankles. Distal sensation is  intact.  No calf asymmetry, superficial collaterals, palpable cords, edema, Homans sign negative bilaterally.    Neurological: She is alert and oriented to person, place, and time.  Psychiatric: She has a normal mood and affect.    ED Course  Procedures (including critical care time) Labs Review Labs Reviewed - No data to display  Imaging Review No results found.   EKG Interpretation None      MDM   Final diagnoses:  Bilateral lower extremity pain  Acute pharyngitis, unspecified pharyngitis type    Filed Vitals:   04/28/14 1744  BP: 149/87  Pulse: 90  Temp: 98.5 F (36.9 C)  TempSrc: Oral  Resp: 20  Height: 5\' 4"  (1.626 m)  Weight: 180 lb (81.647 kg)  SpO2: 99%    Medications  acetaminophen (TYLENOL) tablet  1,000 mg (1,000 mg Oral Given 04/28/14 1815)  cyclobenzaprine (FLEXERIL) tablet 10 mg (10 mg Oral Given 04/28/14 1815)    Pablo LawrenceMelinda J Accardi is a 43 y.o. female presenting with diffuse atraumatic, exquisite, bilateral lower extremity pain. Physical exam with no abnormality except for severe tenderness to palpation. Patient also reports a sore throat, does not appear to be strep. Will treat symptomatically. I have encouraged patient to call Medicare to be reassigned a primary care physician.  Evaluation does not show pathology that would require ongoing emergent intervention or inpatient treatment. Pt is hemodynamically stable and mentating appropriately. Discussed findings and plan with patient/guardian, who agrees with care plan. All questions answered. Return precautions discussed and outpatient follow up given.   New Prescriptions   CYCLOBENZAPRINE (FLEXERIL) 10 MG TABLET    Take 1 tablet (10 mg total) by mouth 2 (two) times daily as needed for muscle spasms.         Wynetta Emeryicole Azha Constantin, PA-C 04/28/14 1820

## 2014-04-28 NOTE — ED Notes (Signed)
Bilateral leg pain for weeks now.  Also c/o sorethroat times one day.

## 2014-05-01 NOTE — ED Provider Notes (Signed)
Medical screening examination/treatment/procedure(s) were performed by non-physician practitioner and as supervising physician I was immediately available for consultation/collaboration.   EKG Interpretation None        Cambridge Deleo, DO 05/01/14 1526 

## 2014-05-09 ENCOUNTER — Encounter (HOSPITAL_COMMUNITY): Payer: Self-pay | Admitting: Psychiatry

## 2014-05-09 ENCOUNTER — Ambulatory Visit (INDEPENDENT_AMBULATORY_CARE_PROVIDER_SITE_OTHER): Payer: Medicare Other | Admitting: Psychiatry

## 2014-05-09 VITALS — BP 115/71 | HR 103 | Ht 64.0 in | Wt 185.4 lb

## 2014-05-09 DIAGNOSIS — F411 Generalized anxiety disorder: Secondary | ICD-10-CM

## 2014-05-09 DIAGNOSIS — F3130 Bipolar disorder, current episode depressed, mild or moderate severity, unspecified: Secondary | ICD-10-CM

## 2014-05-09 DIAGNOSIS — F3160 Bipolar disorder, current episode mixed, unspecified: Secondary | ICD-10-CM

## 2014-05-09 MED ORDER — CLONAZEPAM 1 MG PO TABS: 1.0000 mg | ORAL_TABLET | Freq: Three times a day (TID) | ORAL | Status: DC

## 2014-05-09 MED ORDER — VENLAFAXINE HCL ER 150 MG PO CP24: 150.0000 mg | ORAL_CAPSULE | Freq: Every day | ORAL | Status: DC

## 2014-05-09 MED ORDER — LAMOTRIGINE 100 MG PO TABS: 100.0000 mg | ORAL_TABLET | Freq: Every day | ORAL | Status: DC
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
## 2014-05-17 ENCOUNTER — Emergency Department (HOSPITAL_COMMUNITY)
Admission: EM | Admit: 2014-05-17 | Discharge: 2014-05-17 | Disposition: A | Discharge status: Home / Self Care | Payer: Medicare Other | Attending: Emergency Medicine | Admitting: Emergency Medicine

## 2014-05-17 ENCOUNTER — Encounter (HOSPITAL_COMMUNITY): Payer: Self-pay | Admitting: Emergency Medicine

## 2014-05-17 DIAGNOSIS — I10 Essential (primary) hypertension: Secondary | ICD-10-CM | POA: Diagnosis not present

## 2014-05-17 DIAGNOSIS — A499 Bacterial infection, unspecified: Secondary | ICD-10-CM | POA: Insufficient documentation

## 2014-05-17 DIAGNOSIS — B9689 Other specified bacterial agents as the cause of diseases classified elsewhere: Secondary | ICD-10-CM | POA: Insufficient documentation

## 2014-05-17 DIAGNOSIS — N760 Acute vaginitis: Secondary | ICD-10-CM | POA: Diagnosis present

## 2014-05-17 DIAGNOSIS — F411 Generalized anxiety disorder: Secondary | ICD-10-CM | POA: Insufficient documentation

## 2014-05-17 DIAGNOSIS — E119 Type 2 diabetes mellitus without complications: Secondary | ICD-10-CM | POA: Diagnosis not present

## 2014-05-17 DIAGNOSIS — Z3202 Encounter for pregnancy test, result negative: Secondary | ICD-10-CM | POA: Diagnosis not present

## 2014-05-17 DIAGNOSIS — Z792 Long term (current) use of antibiotics: Secondary | ICD-10-CM | POA: Insufficient documentation

## 2014-05-17 DIAGNOSIS — Z79899 Other long term (current) drug therapy: Secondary | ICD-10-CM | POA: Insufficient documentation

## 2014-05-17 DIAGNOSIS — Z794 Long term (current) use of insulin: Secondary | ICD-10-CM | POA: Insufficient documentation

## 2014-05-17 DIAGNOSIS — Z8739 Personal history of other diseases of the musculoskeletal system and connective tissue: Secondary | ICD-10-CM | POA: Insufficient documentation

## 2014-05-17 DIAGNOSIS — F17200 Nicotine dependence, unspecified, uncomplicated: Secondary | ICD-10-CM | POA: Insufficient documentation

## 2014-05-17 DIAGNOSIS — F319 Bipolar disorder, unspecified: Secondary | ICD-10-CM | POA: Insufficient documentation

## 2014-05-17 LAB — URINALYSIS, ROUTINE W REFLEX MICROSCOPIC
Bilirubin Urine: NEGATIVE
GLUCOSE, UA: 500 mg/dL — AB
HGB URINE DIPSTICK: NEGATIVE
KETONES UR: NEGATIVE mg/dL
Leukocytes, UA: NEGATIVE
Nitrite: NEGATIVE
PROTEIN: NEGATIVE mg/dL
Specific Gravity, Urine: 1.01 (ref 1.005–1.030)
Urobilinogen, UA: 0.2 mg/dL (ref 0.0–1.0)
pH: 6.5 (ref 5.0–8.0)

## 2014-05-17 LAB — WET PREP, GENITAL
TRICH WET PREP: NONE SEEN
YEAST WET PREP: NONE SEEN

## 2014-05-17 LAB — POC URINE PREG, ED: Preg Test, Ur: NEGATIVE

## 2014-05-17 MED ORDER — FLUCONAZOLE 100 MG PO TABS
150.0000 mg | ORAL_TABLET | Freq: Once | ORAL | Status: AC
  Administered 2014-05-17: 150 mg via ORAL
  Filled 2014-05-17: qty 2

## 2014-05-17 MED ORDER — METRONIDAZOLE 500 MG PO TABS: 500.0000 mg | ORAL_TABLET | Freq: Two times a day (BID) | ORAL | Status: DC

## 2014-05-17 NOTE — ED Notes (Signed)
Pt reports burning & swelling of genitals for the past 5 days. Pt states does not have an OB/GYN Md.

## 2014-05-17 NOTE — ED Notes (Signed)
Patient complaining of swelling, itching, and burning in vaginal area starting 5 days ago.

## 2014-05-17 NOTE — Discharge Instructions (Signed)
Bacterial Vaginosis Bacterial vaginosis is a vaginal infection that occurs when the normal balance of bacteria in the vagina is disrupted. It results from an overgrowth of certain bacteria. This is the most common vaginal infection in women of childbearing age. Treatment is important to prevent complications, especially in pregnant women, as it can cause a premature delivery. CAUSES  Bacterial vaginosis is caused by an increase in harmful bacteria that are normally present in smaller amounts in the vagina. Several different kinds of bacteria can cause bacterial vaginosis. However, the reason that the condition develops is not fully understood. RISK FACTORS Certain activities or behaviors can put you at an increased risk of developing bacterial vaginosis, including:  Having a new sex partner or multiple sex partners.  Douching.  Using an intrauterine device (IUD) for contraception. Women do not get bacterial vaginosis from toilet seats, bedding, swimming pools, or contact with objects around them. SIGNS AND SYMPTOMS  Some women with bacterial vaginosis have no signs or symptoms. Common symptoms include:  Grey vaginal discharge.  A fishlike odor with discharge, especially after sexual intercourse.  Itching or burning of the vagina and vulva.  Burning or pain with urination. DIAGNOSIS  Your health care provider will take a medical history and examine the vagina for signs of bacterial vaginosis. A sample of vaginal fluid may be taken. Your health care provider will look at this sample under a microscope to check for bacteria and abnormal cells. A vaginal pH test may also be done.  TREATMENT  Bacterial vaginosis may be treated with antibiotic medicines. These may be given in the form of a pill or a vaginal cream. A second round of antibiotics may be prescribed if the condition comes back after treatment.  HOME CARE INSTRUCTIONS   Only take over-the-counter or prescription medicines as  directed by your health care provider.  If antibiotic medicine was prescribed, take it as directed. Make sure you finish it even if you start to feel better.  Do not have sex until treatment is completed.  Tell all sexual partners that you have a vaginal infection. They should see their health care provider and be treated if they have problems, such as a mild rash or itching.  Practice safe sex by using condoms and only having one sex partner. SEEK MEDICAL CARE IF:   Your symptoms are not improving after 3 days of treatment.  You have increased discharge or pain.  You have a fever. MAKE SURE YOU:   Understand these instructions.  Will watch your condition.  Will get help right away if you are not doing well or get worse. FOR MORE INFORMATION  Centers for Disease Control and Prevention, Division of STD Prevention: SolutionApps.co.za American Sexual Health Association (ASHA): www.ashastd.org  Document Released: 09/01/2005 Document Revised: 06/22/2013 Document Reviewed: 04/13/2013 [facility] Patient Information 2015 [place], [place]. This information is not intended to replace advice given to you by your health care provider. Make sure you discuss any questions you have with your health care provider.  Vaginitis Vaginitis is an inflammation of the vagina. It can happen when the normal bacteria and yeast in the vagina grow too much. There are different types. Treatment will depend on the type you have. HOME CARE  Take all medicines as told by your doctor.  Keep your vagina area clean and dry. Avoid soap. Rinse the area with water.  Avoid washing and cleaning out the vagina (douching).  Do not use tampons or have sex (intercourse) until your treatment is done.  Wipe from front to back after going to the restroom.  Wear cotton underwear.  Avoid wearing underwear while you sleep until your vaginitis is gone.  Avoid tight pants. Avoid underwear or nylons without a cotton  panel.  Take off wet clothing (such as a bathing suit) as soon as you can.  Use mild, unscented products. Avoid fabric softeners and scented:  Feminine sprays.  Laundry detergents.  Tampons.  Soaps or bubble baths.  Practice safe sex and use condoms. GET HELP RIGHT AWAY IF:   You have belly (abdominal) pain.  You have a fever or lasting symptoms for more than 2-3 days.  You have a fever and your symptoms suddenly get worse. MAKE SURE YOU:   Understand these instructions.  Will watch this condition.  Will get help right away if you are not doing well or get worse. Document Released: 11/28/2008 Document Revised: 05/26/2012 Document Reviewed: 02/12/2012 [facility] Patient Information 2015 [place], [place]. This information is not intended to replace advice given to you by your health care provider. Make sure you discuss any questions you have with your health care provider.   Emergency Department Resource Guide 1) Find a Doctor and Pay Out of Pocket Although you won't have to find out who is covered by your insurance plan, it is a good idea to ask around and get recommendations. You will then need to call the office and see if the doctor you have chosen will accept you as a new patient and what types of options they offer for patients who are self-pay. Some doctors offer discounts or will set up payment plans for their patients who do not have insurance, but you will need to ask so you aren't surprised when you get to your appointment.  2) Contact Your Local Health Department Not all health departments have doctors that can see patients for sick visits, but many do, so it is worth a call to see if yours does. If you don't know where your local health department is, you can check in your phone book. The CDC also has a tool to help you locate your state's health department, and many state websites also have listings of all of their local health departments.  3) Find a Walk-in  Clinic If your illness is not likely to be very severe or complicated, you may want to try a walk in clinic. These are popping up all over the country in pharmacies, drugstores, and shopping centers. They're usually staffed by nurse practitioners or physician assistants that have been trained to treat common illnesses and complaints. They're usually fairly quick and inexpensive. However, if you have serious medical issues or chronic medical problems, these are probably not your best option.  No Primary Care Doctor: - Call Health Connect at  [phone] - they can help you locate a primary care doctor that  accepts your insurance, provides certain services, etc. - Physician Referral Service- [phone]  Chronic Pain Problems: Organization         Address  Phone   Notes  [facility]  [phone] Patients need to be referred by their primary care doctor.   Medication Assistance: Organization         Address  Phone   Notes  [facility] Medication [facility] [address] [place] [place], [place] [address] [phone] --Must be a resident of [facility] -- Must have NO insurance coverage whatsoever (no Medicaid/ Medicare, etc.) -- The pt. MUST have a primary  care doctor that directs their care regularly and follows them in the community   [facility]ist  [phone]   [employer]  [phone]    Agencies that provide inexpensive medical care: Organization         Address  Phone   Notes  [facility]  [phone]   [facility]    [phone]   [facility] [address] [place], [place] [address] [phone]   [facility] [address] [place] [place] [phone]   Planned Parenthood    [phone]   [facility]    [phone]   [facility] and [facility]  [address], [place] Phone:  [phone], Fax:  [phone] Hours  of Operation:  9 am - 6 pm, M-F.  Also accepts Medicaid/Medicare and self-pay.  [facility]  [address], [place] Phone: [phone], Fax: [phone]. Hours of Operation:  8:30 am - 5:30 pm, M-F.  Also accepts Medicaid and self-pay.  [facility] [place] [address] [place] Phone: [phone]   [facility] [address] Natasha Bence [place], [place] [phone], Ext. [phone] Mondays & Thursdays: 7-9 AM.  First 15 patients are seen on a first come, first serve basis.    Medicaid-accepting [facility] Providers:  Organization         Address  Phone   Notes  [facility] [address], [place] [phone] Also accepts self-pay patients.  [facility] [address] Laurell Josephs [place], [place]  [phone]   [facility] [address], [place] [phone]   [facility] [address] [place] [phone]   Renaye Rakers [address], [place]   [phone] Only accepts [place] Access [place] patients after they have their name applied to their card.   Self-Pay (no insurance) in [facility]:  Organization         Address  Phone   Notes  Sickle Cell Patients, [facility] Internal Medicine [address] [place], [place] [phone]   [facility] [address] [place], [place] [phone]   [facility] [place]  [address], [place] [phone]   [facility]/Dr. Osei-Bonsu  [address] [place] or [address], [place] [phone] Phone number for both [place] and [place] locations is the same.  [facility]gent Medical and [facility] [address], [place] [phone]   [facility] [address] [place] or [address] [phone]   [facility] [address] [place] [phone], phone; [phone], fax Sees patients 1st and 3rd Saturday of every month.  Must not qualify for public or private insurance (i.e. Medicaid, Medicare, [place] Health Choice, Veterans' Benefits)  Household income should be no more than 200% of the poverty level The clinic cannot treat you if you are pregnant or think you are pregnant  Sexually transmitted diseases are not treated at the clinic.    Dental Care: Organization         Address  Phone  Notes  [facility] Department of [facility] [address] [place], [place] [phone]  Accepts children up to age 2 who are enrolled in Medicaid or [place] Health Choice; pregnant women with a Medicaid card; and children who have applied for Medicaid or [place] Health Choice, but were declined, whose parents can pay a reduced fee at time of service.  [facility] of [facility]  [address], [place] [phone] Accepts children up to age 47 who are enrolled in [place] or [place] Health Choice; pregnant women with a Medicaid card; and children who have applied for Medicaid or  Health Choice, but were declined, whose parents can pay a reduced fee at time of service.  [facility]  [address] [place], [place] [phone] Patients are seen by appointment only. Walk-ins are not accepted. [facility] will see patients 46 years of age and older. Monday - Tuesday (8am-5pm) Most Wednesdays (8:30-5pm) $30 per visit, cash only  [facility]  [address], [facility] [phone] Patients are seen by appointment only. Walk-ins are not accepted. [facility] will see patients 37 years of age and older. One Wednesday Evening (Monthly: Volunteer Based).  $30 per visit, cash only  [employer]  [phone] for adults; Children under age 28, call Graduate  Pediatric Dentistry at [phone]. Children aged 8-14, please call [phone] to request a pediatric application.  Dental services are provided in all areas of dental care including fillings, crowns and bridges, complete and partial dentures, implants, gum treatment, root canals, and extractions. Preventive care is also provided. Treatment is provided to both adults and children. Patients are selected via a lottery and there is often a waiting list.   [facility] [address] [place]  [phone] [email or web address]   [facility] [address] [place], [place] [phone], Ext. [phone] Second and Fourth Thursday of each month, opens at 6:30 AM; Clinic ends at 9 AM.  Patients are seen on a first-come first-served basis, and a limited number are seen during each clinic.   [facility]  [address] Ether Griffins [place], [place] [phone]   Eligibility Requirements You must have lived in [place], [place], or [place] counties for at least the last three months.   You cannot be eligible for state or federal sponsored [place], including [employer], [place], or [employer].   You generally cannot be eligible for healthcare insurance through your employer.    How to apply: Eligibility screenings are held every Tuesday and Wednesday afternoon from 1:00 pm until 4:00 pm. You do not need an appointment for the interview!  [facility] [address] [place], [place] [phone]   [facility] Health Department  [phone]   [facility] Department  [phone]   [facility] Health Department  [phone]    Behavioral Health Resources in the Community: Intensive Outpatient Programs Organization         Address  Phone  Notes  [facility] [address] [place], [place] [phone]   [facility] Outpatient [address] [place] [address], [place], [place]  [phone]   [facility] [address] [place], [place]  [phone]   [facility] Mental [facility] [address]  [place], [place] [phone] or [phone]   Substance Abuse Resources Organization         Address  Phone  Notes  [facility] Drug [facility]  [phone]  [facility]  [phone]   [facility] [place]  [phone]   Floydene Flock  [phone]   Residential & Outpatient Substance Abuse Program  [phone]   Psychological Services Organization         Address  Phone  Notes  [facility] Behavioral Health  [phone]   [facility]  [phone]   [facility] Mental Health [address], [place] [phone] or [phone]    Mobile Crisis Teams Organization         Address  Phone  Notes  [facility], Mobile Crisis Care Unit  [phone]   [facility]  [address] [place], [place] [phone]   Doristine Locks [address] [place] [phone]    Self-Help/Support Groups Organization         Address  Phone             Notes  [facility] [place] - variety of support groups  336- I7437963 Call for more information  Narcotics Anonymous (NA), [facility] [address], [employer] [place]  2 meetings at this location   Statistician         Address  Phone  Notes  [facility] [address],    [place]  [phone]   [facility]  [address], [place] [address], [place], [place] [phone]   [facility] [address] [place], [place] [phone] Admissions: 8am-3pm M-F  [facility] [address]    [place], [place] [phone]   [facility] [address] [place], [place], [place] [phone]   [facility] [address],  [place], [place] [phone]   [facility] - Intensive Outpatient [address] Laurell Josephs [address], [place], [place] [phone]   [facility] ([facility] [address] [place]  [place], [place] [phone] or [phone]   [facility] ([facility]) [address], [place], [place] [phone] Accepts Medicaid  [facility] [address],  [place] [phone] Substance Abuse/Addiction Treatment   [facility] Organization         Address  Phone  Notes  [facility]  8634315559   Angie Fava, PhD [address] Ervin Knack [place], [place]   [phone] or [phone]   [facility] Behavioral   [address] [place], [place] [phone]   [facility] [address] [place], [place] [phone] Insurance/Medicaid/sponsorship through [facility] and Families [address]                                    [place], [place] [phone] Therapy/tele-psych/case  [facility] [address] [place], [place] [phone]    Dr. Lolly Mustache  [phone]   [facility]  [employer] [facility] Dept. 1) [address] [place] 2) [address] [place] 3)  [address], [place] [phone]  [phone]   [facility] Child Abuse Hotline (438) 499-2289 or (786)815-7820 (After Hours)        You have been treated for a possible yeast infection tonight.  You are also being treated for bacterial vaginosis.  Take the entire course of the flagyl prescribed.  Do not drink alcohol while taking this  medicine.  You can apply desitin or other diaper cream of choice which can help relieve the burning pain.  Another option would be vagisil brand products for external pain relief.

## 2014-05-18 LAB — RPR

## 2014-05-18 LAB — HIV ANTIBODY (ROUTINE TESTING W REFLEX): HIV 1&2 Ab, 4th Generation: NONREACTIVE

## 2014-05-19 LAB — GC/CHLAMYDIA PROBE AMP
CT Probe RNA: NEGATIVE
GC PROBE AMP APTIMA: NEGATIVE

## 2014-05-19 NOTE — ED Provider Notes (Signed)
Medical screening examination/treatment/procedure(s) were performed by non-physician practitioner and as supervising physician I was immediately available for consultation/collaboration.   EKG Interpretation None       Glynn Octave, MD 05/19/14 1400

## 2014-05-19 NOTE — ED Provider Notes (Signed)
CSN: [id number]     Arrival date & time 05/17/14  1851 History   First MD Initiated Contact with Patient 05/17/14 2134     Chief Complaint  Patient presents with  . Vaginitis     (Consider location/radiation/quality/duration/timing/severity/associated sxs/prior Treatment) The history is provided by the patient.   Jaclyn Cruz is a 43 y.o. female presenting with vaginal irritation without discharge for the past 5 days.  She describes, itching, burning pain which is worsened with attempts at intercourse and urination.  She denies increased rash, urinary frequency, hematuria, back pain, abdominal or pelvic pain, or nausea, vomiting and denies fever as well. She is in a monogamous relationship.  Her husband has no symptoms.  She has used monistat without relief of symptoms.     Past Medical History  Diagnosis Date  . Disc disorder of cervical region   . Hypertension   . High cholesterol   . Mood disorder   . Diabetes mellitus   . Diabetes mellitus, type II   . Bipolar disorder   . Anxiety    Past Surgical History  Procedure Laterality Date  . Cholecystectomy    . Tubal ligation     Family History  Problem Relation Age of Onset  . Diabetes Father   . Heart failure Father   . Stroke Father   . Alcohol abuse Father   . Depression Mother   . Depression Brother   . Depression Son    History  Substance Use Topics  . Smoking status: Current Every Day Smoker    Types: Cigarettes  . Smokeless tobacco: Never Used  . Alcohol Use: No   OB History   Grav Para Term Preterm Abortions TAB SAB Ect Mult Living   Review of Systems  Constitutional: Negative for fever.  HENT: Negative for congestion and sore throat.   Eyes: Negative.   Respiratory: Negative for chest tightness and shortness of breath.   Cardiovascular: Negative for chest pain.  Gastrointestinal: Negative for nausea and abdominal pain.  Genitourinary: Positive for dysuria. Negative for  urgency, frequency, hematuria, vaginal discharge, genital sores and pelvic pain.  Musculoskeletal: Negative for arthralgias, joint swelling and neck pain.  Skin: Negative.  Negative for rash and wound.  Neurological: Negative for dizziness, weakness, light-headedness, numbness and headaches.  Psychiatric/Behavioral: Negative.       Allergies  Wheat; Soybean-containing drug products; Tape; Codeine; and Compazine  Home Medications   Prior to Admission medications   Medication Sig Start Date End Date Taking? Authorizing Provider  clonazePAM (KLONOPIN) 1 MG tablet Take 1 tablet (1 mg total) by mouth 3 (three) times daily. 05/09/14 05/09/15 Yes Diannia Ruder, MD  insulin glargine (LANTUS) 100 UNIT/ML injection Inject 60 Units into the skin at bedtime.    Yes Historical Provider, MD  insulin lispro (HUMALOG) 100 UNIT/ML injection Inject 16-22 Units into the skin 3 (three) times daily before meals. Per sliding scale. Pt will not exceed 15 units.   Yes Historical Provider, MD  lamoTRIgine (LAMICTAL) 100 MG tablet Take 1 tablet (100 mg total) by mouth at bedtime. 05/09/14 05/09/15 Yes Diannia Ruder, MD  Lurasidone HCl (LATUDA) 20 MG TABS Take 1 tablet (20 mg total) by mouth daily. 12/29/13  Yes Diannia Ruder, MD  metFORMIN (GLUCOPHAGE) 500 MG tablet Take 500 mg by mouth 2 (two) times daily with a meal.   Yes Historical Provider, MD  oxyCODONE-acetaminophen (PERCOCET/ROXICET) 5-325 MG per  tablet Take 1 tablet by mouth 2 (two) times daily.   Yes Historical Provider, MD  pregabalin (LYRICA) 300 MG capsule Take 300 mg by mouth 2 (two) times daily.   Yes Historical Provider, MD  tiZANidine (ZANAFLEX) 4 MG tablet Take 4 mg by mouth 3 (three) times daily.   Yes Historical Provider, MD  venlafaxine XR (EFFEXOR-XR) 150 MG 24 hr capsule Take 1 capsule (150 mg total) by mouth daily. 05/09/14 05/09/15 Yes Diannia Ruder, MD  metroNIDAZOLE (FLAGYL) 500 MG tablet Take 1 tablet (500 mg total) by mouth 2 (two) times daily.  05/17/14   Burgess Amor, PA-C   BP 116/81  Pulse 94  Temp(Src) 98.4 F (36.9 C) (Oral)  Resp 16  Ht  (1.626 m)  Wt 175 lb (79.379 kg)  BMI 30.02 kg/m2  SpO2 94%  LMP 05/10/2014 Physical Exam  Nursing note and vitals reviewed. Constitutional: She appears well-developed and well-nourished.  HENT:  Head: Normocephalic and atraumatic.  Eyes: Conjunctivae are normal.  Neck: Normal range of motion.  Cardiovascular: Normal rate, regular rhythm, normal heart sounds and intact distal pulses.   Pulmonary/Chest: Effort normal and breath sounds normal. She has no wheezes.  Abdominal: Soft. Bowel sounds are normal. She exhibits no distension. There is no tenderness.  No cva tenderness.  Genitourinary: Uterus normal. Cervix exhibits no motion tenderness and no discharge. Right adnexum displays no mass, no tenderness and no fullness. Left adnexum displays no mass, no tenderness and no fullness. No erythema around the vagina. No vaginal discharge found.  Labia appears irritated, erythematous with excoriations.  No lesions noted.  Musculoskeletal: Normal range of motion.  Neurological: She is alert.  Skin: Skin is warm and dry.  Psychiatric: She has a normal mood and affect.    ED Course  Procedures (including critical care time) Labs Review Labs Reviewed  WET PREP, GENITAL - Abnormal; Notable for the following:    Clue Cells Wet Prep HPF POC FEW (*)    WBC, Wet Prep HPF POC FEW (*)    All other components within normal limits  URINALYSIS, ROUTINE W REFLEX MICROSCOPIC - Abnormal; Notable for the following:    Glucose, UA 500 (*)    All other components within normal limits  GC/CHLAMYDIA PROBE AMP  RPR  HIV ANTIBODY (ROUTINE TESTING)  POC URINE PREG, ED    Imaging Review No results found.   EKG Interpretation None      MDM   Final diagnoses:  Vaginitis  Bacterial vaginosis    Cultures pending, pt aware she will be notified of any positive results.  She was given  diflucan, given sx,  I suspect she may only partially treated yeast vaginitis.  Also prescribed flagyl for bv.  Encouraged f/u with gyn (not current on gyn screening. Referral given.    Burgess Amor, PA-C 05/19/14 1357

## 2014-05-25 ENCOUNTER — Ambulatory Visit (INDEPENDENT_AMBULATORY_CARE_PROVIDER_SITE_OTHER): Payer: Medicare Other | Admitting: Psychiatry

## 2014-05-25 ENCOUNTER — Encounter (HOSPITAL_COMMUNITY): Payer: Self-pay | Admitting: Psychiatry

## 2014-05-25 DIAGNOSIS — F3130 Bipolar disorder, current episode depressed, mild or moderate severity, unspecified: Secondary | ICD-10-CM

## 2014-05-25 NOTE — Patient Instructions (Signed)
Discussed orally 
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
## 2014-06-07 ENCOUNTER — Ambulatory Visit (HOSPITAL_COMMUNITY): Payer: Self-pay | Admitting: Psychiatry

## 2014-06-09 ENCOUNTER — Ambulatory Visit (INDEPENDENT_AMBULATORY_CARE_PROVIDER_SITE_OTHER): Payer: Medicare Other | Admitting: Psychiatry

## 2014-06-09 ENCOUNTER — Encounter (HOSPITAL_COMMUNITY): Payer: Self-pay | Admitting: Psychiatry

## 2014-06-09 VITALS — BP 147/84 | HR 113 | Ht 64.0 in | Wt 184.0 lb

## 2014-06-09 DIAGNOSIS — F411 Generalized anxiety disorder: Secondary | ICD-10-CM

## 2014-06-09 DIAGNOSIS — F3130 Bipolar disorder, current episode depressed, mild or moderate severity, unspecified: Secondary | ICD-10-CM

## 2014-06-09 MED ORDER — VENLAFAXINE HCL ER 150 MG PO CP24: 150.0000 mg | ORAL_CAPSULE | Freq: Every day | ORAL | Status: DC

## 2014-06-09 MED ORDER — LAMOTRIGINE 100 MG PO TABS: 100.0000 mg | ORAL_TABLET | Freq: Every day | ORAL | Status: DC

## 2014-06-09 MED ORDER — ALPRAZOLAM 1 MG PO TABS: 1.0000 mg | ORAL_TABLET | Freq: Three times a day (TID) | ORAL | Status: DC

## 2014-06-09 NOTE — Patient Instructions (Signed)
Latuda 40 mg samples, take with food
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
## 2014-06-12 ENCOUNTER — Telehealth (HOSPITAL_COMMUNITY): Payer: Self-pay | Admitting: *Deleted

## 2014-06-14 ENCOUNTER — Ambulatory Visit (HOSPITAL_COMMUNITY): Payer: Self-pay | Admitting: Psychiatry

## 2014-06-16 ENCOUNTER — Ambulatory Visit (HOSPITAL_COMMUNITY): Payer: Self-pay | Admitting: Psychiatry

## 2014-06-16 ENCOUNTER — Encounter (HOSPITAL_COMMUNITY): Payer: Self-pay | Admitting: Psychology

## 2014-07-04 ENCOUNTER — Encounter (HOSPITAL_COMMUNITY): Payer: Self-pay | Admitting: Psychiatry

## 2014-07-04 ENCOUNTER — Ambulatory Visit (HOSPITAL_COMMUNITY): Payer: Self-pay | Admitting: Psychiatry

## 2014-07-04 ENCOUNTER — Ambulatory Visit (INDEPENDENT_AMBULATORY_CARE_PROVIDER_SITE_OTHER): Payer: Medicare Other | Admitting: Psychiatry

## 2014-07-04 VITALS — BP 134/84 | HR 100 | Ht 64.0 in | Wt 184.6 lb

## 2014-07-04 DIAGNOSIS — F411 Generalized anxiety disorder: Secondary | ICD-10-CM

## 2014-07-04 DIAGNOSIS — F3162 Bipolar disorder, current episode mixed, moderate: Secondary | ICD-10-CM

## 2014-07-04 MED ORDER — LURASIDONE HCL 20 MG PO TABS: 20.0000 mg | ORAL_TABLET | Freq: Every day | ORAL | Status: DC

## 2014-07-04 MED ORDER — LAMOTRIGINE 100 MG PO TABS: 100.0000 mg | ORAL_TABLET | Freq: Every day | ORAL | Status: DC

## 2014-07-04 MED ORDER — VENLAFAXINE HCL ER 150 MG PO CP24: 300.0000 mg | ORAL_CAPSULE | Freq: Two times a day (BID) | ORAL | Status: DC
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
## 2014-07-14 ENCOUNTER — Emergency Department (HOSPITAL_COMMUNITY)
Admission: EM | Admit: 2014-07-14 | Discharge: 2014-07-15 | Disposition: A | Discharge status: Home / Self Care | Payer: Medicare Other | Attending: Emergency Medicine | Admitting: Emergency Medicine

## 2014-07-14 ENCOUNTER — Emergency Department (HOSPITAL_COMMUNITY): Payer: Medicare Other

## 2014-07-14 ENCOUNTER — Encounter (HOSPITAL_COMMUNITY): Payer: Self-pay | Admitting: Emergency Medicine

## 2014-07-14 DIAGNOSIS — E119 Type 2 diabetes mellitus without complications: Secondary | ICD-10-CM | POA: Diagnosis not present

## 2014-07-14 DIAGNOSIS — M25512 Pain in left shoulder: Secondary | ICD-10-CM | POA: Diagnosis present

## 2014-07-14 DIAGNOSIS — Z720 Tobacco use: Secondary | ICD-10-CM | POA: Diagnosis not present

## 2014-07-14 DIAGNOSIS — R52 Pain, unspecified: Secondary | ICD-10-CM

## 2014-07-14 DIAGNOSIS — F319 Bipolar disorder, unspecified: Secondary | ICD-10-CM | POA: Insufficient documentation

## 2014-07-14 DIAGNOSIS — Z79899 Other long term (current) drug therapy: Secondary | ICD-10-CM | POA: Diagnosis not present

## 2014-07-14 DIAGNOSIS — I10 Essential (primary) hypertension: Secondary | ICD-10-CM | POA: Insufficient documentation

## 2014-07-14 DIAGNOSIS — Z8639 Personal history of other endocrine, nutritional and metabolic disease: Secondary | ICD-10-CM | POA: Insufficient documentation

## 2014-07-14 DIAGNOSIS — M542 Cervicalgia: Secondary | ICD-10-CM | POA: Diagnosis not present

## 2014-07-14 DIAGNOSIS — Z760 Encounter for issue of repeat prescription: Secondary | ICD-10-CM | POA: Diagnosis not present

## 2014-07-14 DIAGNOSIS — F419 Anxiety disorder, unspecified: Secondary | ICD-10-CM | POA: Insufficient documentation

## 2014-07-14 MED ORDER — INSULIN GLARGINE 100 UNIT/ML ~~LOC~~ SOLN: 60.0000 [IU] | Freq: Every day | SUBCUTANEOUS | Status: DC

## 2014-07-14 MED ORDER — HYDROCODONE-ACETAMINOPHEN 5-325 MG PO TABS
1.0000 | ORAL_TABLET | Freq: Once | ORAL | Status: AC
  Administered 2014-07-14: 1 via ORAL

## 2014-07-14 MED ORDER — INSULIN LISPRO 100 UNIT/ML ~~LOC~~ SOLN: SUBCUTANEOUS | Status: DC

## 2014-07-14 MED ORDER — HYDROCODONE-ACETAMINOPHEN 5-325 MG PO TABS
ORAL_TABLET | ORAL | Status: AC
  Filled 2014-07-14: qty 1

## 2014-07-14 MED ORDER — HYDROCODONE-ACETAMINOPHEN 5-325 MG PO TABS: ORAL_TABLET | ORAL | Status: DC

## 2014-07-14 MED ORDER — NAPROXEN 500 MG PO TABS: 500.0000 mg | ORAL_TABLET | Freq: Two times a day (BID) | ORAL | Status: DC

## 2014-07-14 MED ORDER — OXYCODONE-ACETAMINOPHEN 5-325 MG PO TABS: 1.0000 | ORAL_TABLET | Freq: Once | ORAL | Status: DC

## 2014-07-14 NOTE — ED Notes (Signed)
Patient states left shoulder pain X2weeks. Patient denies injury. Patient has limited range of motion. Patient denies chest pain or shortness of breath. Patient also states right neck pain with movement,

## 2014-07-14 NOTE — Discharge Instructions (Signed)
Shoulder Pain The shoulder is the joint that connects your arms to your body. The bones that form the shoulder joint include the upper arm bone (humerus), the shoulder blade (scapula), and the collarbone (clavicle). The top of the humerus is shaped like a ball and fits into a rather flat socket on the scapula (glenoid cavity). A combination of muscles and strong, fibrous tissues that connect muscles to bones (tendons) support your shoulder joint and hold the ball in the socket. Small, fluid-filled sacs (bursae) are located in different areas of the joint. They act as cushions between the bones and the overlying soft tissues and help reduce friction between the gliding tendons and the bone as you move your arm. Your shoulder joint allows a wide range of motion in your arm. This range of motion allows you to do things like scratch your back or throw a ball. However, this range of motion also makes your shoulder more prone to pain from overuse and injury. Causes of shoulder pain can originate from both injury and overuse and usually can be grouped in the following four categories:  Redness, swelling, and pain (inflammation) of the tendon (tendinitis) or the bursae (bursitis).  Instability, such as a dislocation of the joint.  Inflammation of the joint (arthritis).  Broken bone (fracture). HOME CARE INSTRUCTIONS   Apply ice to the sore area.  Put ice in a plastic bag.  Place a towel between your skin and the bag.  Leave the ice on for 15-20 minutes, 3-4 times per day for the first 2 days, or as directed by your health care provider.  Stop using cold packs if they do not help with the pain.  If you have a shoulder sling or immobilizer, wear it as long as your caregiver instructs. Only remove it to shower or bathe. Move your arm as little as possible, but keep your hand moving to prevent swelling.  Squeeze a soft ball or foam pad as much as possible to help prevent swelling.  Only take  over-the-counter or prescription medicines for pain, discomfort, or fever as directed by your caregiver. SEEK MEDICAL CARE IF:   Your shoulder pain increases, or new pain develops in your arm, hand, or fingers.  Your hand or fingers become cold and numb.  Your pain is not relieved with medicines. SEEK IMMEDIATE MEDICAL CARE IF:   Your arm, hand, or fingers are numb or tingling.  Your arm, hand, or fingers are significantly swollen or turn white or blue. MAKE SURE YOU:   Understand these instructions.  Will watch your condition.  Will get help right away if you are not doing well or get worse. Document Released: 06/11/2005 Document Revised: 01/16/2014 Document Reviewed: 08/16/2011 [facility] Patient Information 2015 [place], [place]. This information is not intended to replace advice given to you by your health care provider. Make sure you discuss any questions you have with your health care provider.  Medication Refill, Emergency Department We have refilled your medication today as a courtesy to you. It is best for your medical care, however, to take care of getting refills done through your primary caregiver's office. They have your records and can do a better job of follow-up than we can in the emergency department. On maintenance medications, we often only prescribe enough medications to get you by until you are able to see your regular caregiver. This is a more expensive way to refill medications. In the future, please plan for refills so that you will not have to  use the emergency department for this. Thank you for your help. Your help allows us to better take care of the daily emergencies that enter our department. Document Released: 12/19/2003 Document Revised: 11/24/2011 Document Reviewed: 12/09/2013 [facility] Patient Information 2015 [place], [place]. This information is not intended to replace advice given to you by your health care provider. Make sure you discuss any questions you  have with your health care provider.    Emergency Department Resource Guide 1) Find a Doctor and Pay Out of Pocket Although you won't have to find out who is covered by your insurance plan, it is a good idea to ask around and get recommendations. You will then need to call the office and see if the doctor you have chosen will accept you as a new patient and what types of options they offer for patients who are self-pay. Some doctors offer discounts or will set up payment plans for their patients who do not have insurance, but you will need to ask so you aren't surprised when you get to your appointment.  2) Contact Your Local Health Department Not all health departments have doctors that can see patients for sick visits, but many do, so it is worth a call to see if yours does. If you don't know where your local health department is, you can check in your phone book. The CDC also has a tool to help you locate your state's health department, and many state websites also have listings of all of their local health departments.  3) Find a Walk-in Clinic If your illness is not likely to be very severe or complicated, you may want to try a walk in clinic. These are popping up all over the country in pharmacies, drugstores, and shopping centers. They're usually staffed by nurse practitioners or physician assistants that have been trained to treat common illnesses and complaints. They're usually fairly quick and inexpensive. However, if you have serious medical issues or chronic medical problems, these are probably not your best option.  No Primary Care Doctor: - Call Health Connect at  [phone] - they can help you locate a primary care doctor that  accepts your insurance, provides certain services, etc. - Physician Referral Service- [phone]  Chronic Pain Problems: Organization         Address  Phone   Notes  [facility]  [phone] Patients need to be referred by their  primary care doctor.   Medication Assistance: Organization         Address  Phone   Notes  [facility] [address] [place], [place] [address] [phone]) [phone] --Must be a resident of [place] [facility] -- Must have NO insurance coverage whatsoever (no Medicaid/ Medicare, etc.) -- The pt. MUST have a primary care doctor that directs their care regularly and follows them in the community   MedAssist  [phone]   [employer]  [phone]    Agencies that provide inexpensive medical care: Organization         Address  Phone   Notes  [facility]  [phone]   [facility]    [phone]   [facility] [address] [place], [place] [address] [phone]   [facility] [address], [place] [phone]   [facility]anned Parenthood    [phone]   [facility]    [phone]   [facility]  [address], [place] Phone:  [phone], Fax:  [phone] Hours of Operation:  9 am - 6 pm, M-F.  Also accepts Medicaid/Medicare and self-pay.  [facility]  [address], [place] Phone: [phone], Fax: [phone]. Hours of Operation:  8:30 am - 5:30 pm, M-F.  Also accepts Medicaid and self-pay.  [facility] [place] [address], [place] Phone: [phone]   [facility] [address] Natasha [place], [place] [phone], Ext. [phone] Mondays & Thursdays: 7-9 AM.  First 15 patients are seen on a first come, first serve basis.    Medicaid-accepting [facility] Providers:  Organization         Address  Phone   Notes  [facility] [address], [place] [phone] Also accepts self-pay patients.  [facility] [address] Laurell Josephs [place], [place]  [phone]   [facility] [address], [place] [phone]   [facility] [address] [place] [phone]   Renaye Rakers [address], [place]   [phone] Only accepts [place] patients after they have their name applied to their card.   Self-Pay (no insurance) in [facility]:  Organization         Address  Phone   Notes  Sickle Cell Patients, [facility] [address] [place], [place] [phone]   [facility] [address] [place], [place] [phone]   [facility]  [address], [place] [phone]   [facility]/Dr. Osei-Bonsu  [address], [place] or [address], [place] [phone] Phone number for both [place] and [place] locations is the same.  [facility]gent [facility] and [facility] [address] [place] [phone]   [facility] [address], [place] or [address] [phone]   [facility] [address], [place] [phone], phone; [phone], fax Sees patients 1st and 3rd Saturday of every month.  Must not qualify for public or private insurance (i.e. Medicaid, Medicare, [place] Health Choice, Veterans' Benefits)  Household income should be no more than 200% of the poverty level The clinic cannot treat you if you are pregnant or think you are pregnant  Sexually transmitted diseases are not treated at the clinic.    Dental Care: Organization         Address  Phone  Notes  [facility] [address] [place], [place] [phone] Accepts children up to age 1 who are enrolled in [place] or [place] Health Choice; pregnant women with a Medicaid card; and children who have applied for Medicaid or [place] Health Choice, but were declined, whose parents can pay a reduced fee  at time of service.  [facility]  [address], [place] [phone] Accepts children up to age 71 who are enrolled in [place] or [place] Health Choice; pregnant women with a Medicaid card; and children who have applied for Medicaid or [place] Health Choice, but were declined, whose parents can pay a reduced fee at time of service.  [facility]  [phone]  Joellyn Quails, [place] [phone] Patients are seen by appointment only. Walk-ins are not accepted. [facility] will see patients 45 years of age and older. Monday - Tuesday (8am-5pm) Most Wednesdays (8:30-5pm) $30 per visit, cash only  [facility] Adult [facility] Access PRO[facility]  [address], [facility] [phone] Patients are seen by appointment only. Walk-ins are not accepted. [facility] will see patients 66 years of age and older. One Wednesday Evening (Monthly: Volunteer Based).  $30 per visit, cash only  [employer]  [phone] for adults; Children under age 42, call Graduate Pediatric Dentistry at [phone]. Children aged 50-14, please call [phone] to request a pediatric application.  Dental services are provided in all areas of dental care including fillings, crowns and bridges, complete and partial dentures, implants, gum treatment, root canals, and extractions. Preventive care is also provided. Treatment is provided to both adults and children. Patients are selected via a lottery and there is often a waiting list.   [facility] [address], [place]  [phone] [email or web address]   [facility] [address] [place], [place] [phone], Ext. [phone] Second and Fourth Thursday of each month, opens at 6:30 AM; Clinic ends at 9 AM.  Patients are seen on a first-come first-served basis, and a limited number are seen during each clinic.   [facility]  [address] Ether Griffins [place], [place] [phone]   Eligibility Requirements You must have lived in [place], [place], or [place] counties for at least the last three months.   You cannot be eligible for state or federal sponsored [place], including [employer], [place], or [employer].   You generally cannot be eligible for healthcare insurance through your employer.    How to apply: Eligibility screenings are held every Tuesday and Wednesday afternoon from 1:00 pm until 4:00 pm. You do not need an appointment for the interview!  [facility] [address], [place], [place] [phone]   [facility] Health Department  [phone]   [facility] Health Department  [phone]   [facility] Health Department  [phone]    Behavioral Health Resources in the Community: Intensive Outpatient Programs Organization         Address  Phone  Notes  [facility] [address], [place], [place] [phone]   [facility] Outpatient [address] [place], [place] [phone]   [facility] [address], [place], [place]  [phone]   [facility] Mental Health [address],  [place], [place] [phone] or [phone]   Substance Abuse Resources Organization         Address  Phone  Notes  Alcohol [facility] Drug Services  [phone]   [facility]  [phone]   [facility] [place]  [phone]   Floydene Flock  [phone]   Residential & Outpatient Substance Abuse Program  [phone]   Psychological Services Organization         Address  Phone  Notes  [facility]  [phone]   [facility] Services  [phone]   [facility] [address], [place] [phone] or [phone]    Mobile Crisis Teams Organization         Address  Phone  Notes  [facility], Mobile Crisis Care Unit  [phone]   [facility]  [facility]  [address] [place], [place] [phone]   Jasmine December  DeEsch [address] [place] [phone]    Self-Help/Support Groups Organization         Address  Phone             Notes  [facility] [place] - variety of support groups  336- [phone] Call for more information  Narcotics Anonymous (NA), [facility] [address], [place]  2 meetings at this location   Statisticianesidential Treatment Programs Organization         Address  Phone  Notes  [facility] [address],    [place]  [phone]   [facility]  [address], [place] [phone], [place], [place] [phone]   [facility] [address], [place] [phone] Admissions: 8am-3pm M-F  [facility] [address]    [place], [place] [phone]   [facility] [address], [place], [place] [phone]   [facility] [address]  [place], [place] [phone]   [facility] - Intensive Outpatient [address], [place], [place] [phone]   [facility] ([facility] [address]  [place], [place] [phone] or [phone]   [facility] (RT[facility]) [address] [place], [place] [phone] Accepts Medicaid  [facility] [address]  [place] [phone] Substance Abuse/Addiction Treatment   [facility] Behavioral Health Resources Organization         Address  Phone  Notes  [facility]  [phone](888) [phone]   Angie FavaJulie Brannon, PhD [address] [place], [place]   [phone]) [phone] or [phone]) [phone]   [facility]   [address] [place], [place] [phone](336) [phone]   [facility] [address], [place], [place] [phone]) [phone] Insurance/Medicaid/sponsorship through [facility]  [facility] Families [address]                                    [place], [place] [phone](336)  [phone] Therapy/tele-psych/case  [facility] [address]   [place], [place] [phone](336) [phone]    Dr. Lolly MustacheArfeen  [phone]) [phone]   [facility]  [employer] [facility] 1) [address], [place] 2) [address], [place] 3)  [address], [place] [phone](336) [phone]  [phone](336) [phone]   [facility] Child Abuse Hotline [phone](336) [phone] or [phone] (After Hours)

## 2014-07-14 NOTE — ED Provider Notes (Signed)
CSN: [id number]     Arrival date & time 07/14/14  2028 History   First MD Initiated Contact with Patient 07/14/14 2146     Chief Complaint  Patient presents with  . Shoulder Pain     (Consider location/radiation/quality/duration/timing/severity/associated sxs/prior Treatment) HPI  Jaclyn Cruz is a 43 y.o. female who presents to the Emergency Department complaining of left shoulder pain for two weeks.  She describes aching pain with movement of the left arm.  She reports a history of chronic neck pain and has occasional pain to her left side of her neck with arm movement.  She states that she has recently been discharged from her PMD 's office and currently does not have primary care.  She also reports been nearly out of her insulin and has been dosing her insulin every other day in order to make it last longer.  She denies shortness of breath, headaches, chest pain, numbness or weakness of her arm.    Past Medical History  Diagnosis Date  . Disc disorder of cervical region   . Hypertension   . High cholesterol   . Mood disorder   . Diabetes mellitus   . Diabetes mellitus, type II   . Bipolar disorder   . Anxiety    Past Surgical History  Procedure Laterality Date  . Cholecystectomy    . Tubal ligation     Family History  Problem Relation Age of Onset  . Diabetes Father   . Heart failure Father   . Stroke Father   . Alcohol abuse Father   . Depression Mother   . Depression Brother   . Depression Son   . Depression Son    History  Substance Use Topics  . Smoking status: Current Every Day Smoker -- 0.50 packs/day for 25 years    Types: Cigarettes  . Smokeless tobacco: Never Used  . Alcohol Use: No   OB History   Grav Para Term Preterm Abortions TAB SAB Ect Mult Living   4 4  4      4      Review of Systems  Constitutional: Negative for fever and chills.  Respiratory: Negative for shortness of breath.   Cardiovascular: Negative for chest pain.   Gastrointestinal: Negative for nausea and vomiting.  Endocrine: Negative for polydipsia and polyuria.  Genitourinary: Negative for dysuria and difficulty urinating.  Musculoskeletal: Positive for arthralgias and neck pain. Negative for joint swelling and neck stiffness.       Left shoulder and occasional left neck pain  Skin: Negative for color change and wound.  Neurological: Negative for dizziness, syncope, facial asymmetry, weakness, numbness and headaches.  All other systems reviewed and are negative.     Allergies  Wheat; Soybean-containing drug products; Tape; Codeine; and Compazine  Home Medications   Prior to Admission medications   Medication Sig Start Date End Date Taking? Authorizing Provider  ALPRAZolam Prudy Feeler(XANAX) 1 MG tablet Take 1 tablet (1 mg total) by mouth 3 (three) times daily. 06/09/14 06/09/15 Yes Diannia Rudereborah Ross, MD  insulin glargine (LANTUS) 100 UNIT/ML injection Inject 60 Units into the skin at bedtime.    Yes Historical Provider, MD  insulin lispro (HUMALOG) 100 UNIT/ML injection Inject 16-22 Units into the skin 3 (three) times daily before meals. Per sliding scale. Pt will not exceed 15 units.   Yes Historical Provider, MD  lamoTRIgine (LAMICTAL) 100 MG tablet Take 1 tablet (100 mg total) by mouth at bedtime. 07/04/14 07/04/15 Yes Diannia Rudereborah Ross, MD  lurasidone (  LATUDA) 40 MG TABS tablet Take 40 mg by mouth daily.   Yes Historical Provider, MD  LYRICA 150 MG capsule Take 150 mg by mouth 2 (two) times daily. 06/09/14  Yes Historical Provider, MD  oxyCODONE-acetaminophen (PERCOCET/ROXICET) 5-325 MG per tablet Take 1 tablet by mouth 2 (two) times daily. 06/09/14  Yes Historical Provider, MD  tiZANidine (ZANAFLEX) 2 MG tablet Take 2 mg by mouth 3 (three) times daily. 06/09/14  Yes Historical Provider, MD  venlafaxine XR (EFFEXOR XR) 150 MG 24 hr capsule Take 300 mg by mouth 2 (two) times daily.   Yes Historical Provider, MD   BP 152/92  Pulse 90  Temp(Src) 98.2 F (36.8 C)  (Oral)  Resp 18  Ht 5\' 4"  (1.626 m)  Wt 184 lb (83.462 kg)  BMI 31.57 kg/m2  SpO2 99%  LMP 06/15/2014 Physical Exam  Constitutional: She is oriented to person, place, and time. She appears well-developed and well-nourished. No distress.  HENT:  Head: Normocephalic and atraumatic.  Neck: Normal range of motion, full passive range of motion without pain and phonation normal. Neck supple. Muscular tenderness present. No spinous process tenderness present. No Kernig's sign noted. No thyromegaly present.  Cardiovascular: Normal rate, regular rhythm, normal heart sounds and intact distal pulses.   No murmur heard. Pulmonary/Chest: Effort normal and breath sounds normal. No respiratory distress. She exhibits no tenderness.  Musculoskeletal: She exhibits tenderness. She exhibits no edema.  ttp of the left shoulder.  Pain with abduction of the left arm and rotation of the shoulder.  Radial pulse is brisk, distal sensation intact, CR< 2 sec. Grip strength is strong and symmetrical.   No abrasions, edema , erythema or step-off deformity of the joint.   Lymphadenopathy:    She has no cervical adenopathy.  Neurological: She is alert and oriented to person, place, and time. She has normal strength. No sensory deficit. She exhibits normal muscle tone. Coordination normal.  Skin: Skin is warm and dry.  Nursing note and vitals reviewed.   ED Course  Procedures (including critical care time) Labs Review Labs Reviewed - No data to display  Imaging Review Dg Shoulder Left  07/14/2014   CLINICAL DATA:  Left shoulder pain for 2 weeks. Limited range of motion. Left-sided neck pain.  EXAM: LEFT SHOULDER - 2+ VIEW  COMPARISON:  10/18/2011  FINDINGS: There is no evidence of fracture or dislocation. There is no evidence of arthropathy or other focal bone abnormality. Soft tissues are unremarkable.  IMPRESSION: Normal exam.   Electronically Signed   By: Geanie CooleyJim  Maxwell M.D.   On: 07/14/2014 23:33     EKG  Interpretation None      MDM   Final diagnoses:  Pain  Shoulder pain, acute, left  Medication refill    Patient is well-appearing. Vital signs are stable. Reproducible pain to the left shoulder with abduction. No cervical spine tenderness on exam. Neurovascularly intact. Symptoms likely related to bursitis. Suspicion for septic joint is low. Patient agrees to close orthopedic follow-up and referral will be given. Patient also mentions being out of her insulin and recently being discharged from her primary care physician and is currently without PCP. There are no clinical indications of DKA at this time, I advised the patient that I will prescribe refills for her Lantus and Humalog with the understanding that she will arrange close follow-up, and I have provided her information for [facility]  She appears stable for discharge and agrees to plan  Kamill Fulbright L. Bee Hammerschmidt, PA-C  07/16/14 1710  Raeford Razor, MD 07/20/14 1039

## 2014-07-15 MED ORDER — INSULIN PEN NEEDLE 29G X 12.7MM MISC: Status: DC

## 2014-07-17 ENCOUNTER — Encounter (HOSPITAL_COMMUNITY): Payer: Self-pay | Admitting: Emergency Medicine

## 2014-07-17 ENCOUNTER — Telehealth: Payer: Self-pay | Admitting: Orthopedic Surgery

## 2014-07-17 NOTE — Telephone Encounter (Signed)
Call received from patient, requesting appointment following [facility] visit at [facility] for problem of left shoulder pain, date of service 07/14/14.  Offered appointment upon receipt of referral per her secondary insurance requirement; patient states her Medicaid was issued with the incorrect primary care provider; states she is working on this issue and having it corrected [facility]; appointment pending referral.

## 2014-08-08 ENCOUNTER — Ambulatory Visit (HOSPITAL_COMMUNITY): Payer: Self-pay | Admitting: Psychiatry

## 2014-08-15 ENCOUNTER — Ambulatory Visit (INDEPENDENT_AMBULATORY_CARE_PROVIDER_SITE_OTHER): Payer: Medicare Other | Admitting: Psychiatry

## 2014-08-15 ENCOUNTER — Encounter (HOSPITAL_COMMUNITY): Payer: Self-pay | Admitting: Psychiatry

## 2014-08-15 VITALS — BP 134/89 | HR 100 | Ht 64.0 in | Wt 187.0 lb

## 2014-08-15 DIAGNOSIS — F411 Generalized anxiety disorder: Secondary | ICD-10-CM

## 2014-08-15 DIAGNOSIS — F3162 Bipolar disorder, current episode mixed, moderate: Secondary | ICD-10-CM

## 2014-08-15 MED ORDER — VENLAFAXINE HCL ER 150 MG PO CP24: 150.0000 mg | ORAL_CAPSULE | Freq: Two times a day (BID) | ORAL | Status: DC

## 2014-08-15 MED ORDER — LURASIDONE HCL 60 MG PO TABS: 60.0000 mg | ORAL_TABLET | Freq: Every day | ORAL | Status: DC

## 2014-08-15 MED ORDER — LAMOTRIGINE 100 MG PO TABS
100.0000 mg | ORAL_TABLET | Freq: Every day | ORAL | Status: DC
Start: 2014-08-15 — End: 2014-09-26

## 2014-08-15 MED ORDER — ALPRAZOLAM 1 MG PO TABS: 1.0000 mg | ORAL_TABLET | Freq: Three times a day (TID) | ORAL | Status: DC
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
## 2014-08-16 ENCOUNTER — Encounter (HOSPITAL_COMMUNITY): Payer: Self-pay | Admitting: *Deleted

## 2014-08-16 ENCOUNTER — Emergency Department (HOSPITAL_COMMUNITY): Payer: Medicare Other

## 2014-08-16 ENCOUNTER — Emergency Department (HOSPITAL_COMMUNITY)
Admission: EM | Admit: 2014-08-16 | Discharge: 2014-08-16 | Disposition: A | Discharge status: Home / Self Care | Payer: Medicare Other | Attending: Emergency Medicine | Admitting: Emergency Medicine

## 2014-08-16 DIAGNOSIS — F329 Major depressive disorder, single episode, unspecified: Secondary | ICD-10-CM | POA: Insufficient documentation

## 2014-08-16 DIAGNOSIS — S4992XA Unspecified injury of left shoulder and upper arm, initial encounter: Secondary | ICD-10-CM | POA: Insufficient documentation

## 2014-08-16 DIAGNOSIS — Y998 Other external cause status: Secondary | ICD-10-CM | POA: Insufficient documentation

## 2014-08-16 DIAGNOSIS — Z8639 Personal history of other endocrine, nutritional and metabolic disease: Secondary | ICD-10-CM | POA: Insufficient documentation

## 2014-08-16 DIAGNOSIS — F419 Anxiety disorder, unspecified: Secondary | ICD-10-CM | POA: Insufficient documentation

## 2014-08-16 DIAGNOSIS — W1839XA Other fall on same level, initial encounter: Secondary | ICD-10-CM | POA: Insufficient documentation

## 2014-08-16 DIAGNOSIS — S93401A Sprain of unspecified ligament of right ankle, initial encounter: Secondary | ICD-10-CM | POA: Diagnosis not present

## 2014-08-16 DIAGNOSIS — Z79899 Other long term (current) drug therapy: Secondary | ICD-10-CM | POA: Diagnosis not present

## 2014-08-16 DIAGNOSIS — W19XXXA Unspecified fall, initial encounter: Secondary | ICD-10-CM

## 2014-08-16 DIAGNOSIS — Z720 Tobacco use: Secondary | ICD-10-CM | POA: Diagnosis not present

## 2014-08-16 DIAGNOSIS — I10 Essential (primary) hypertension: Secondary | ICD-10-CM | POA: Diagnosis not present

## 2014-08-16 DIAGNOSIS — S99911A Unspecified injury of right ankle, initial encounter: Secondary | ICD-10-CM | POA: Diagnosis present

## 2014-08-16 DIAGNOSIS — Y929 Unspecified place or not applicable: Secondary | ICD-10-CM | POA: Insufficient documentation

## 2014-08-16 DIAGNOSIS — Z794 Long term (current) use of insulin: Secondary | ICD-10-CM | POA: Diagnosis not present

## 2014-08-16 DIAGNOSIS — S8001XA Contusion of right knee, initial encounter: Secondary | ICD-10-CM | POA: Insufficient documentation

## 2014-08-16 DIAGNOSIS — Y9389 Activity, other specified: Secondary | ICD-10-CM | POA: Diagnosis not present

## 2014-08-16 DIAGNOSIS — E119 Type 2 diabetes mellitus without complications: Secondary | ICD-10-CM | POA: Insufficient documentation

## 2014-08-16 DIAGNOSIS — M25512 Pain in left shoulder: Secondary | ICD-10-CM

## 2014-08-16 MED ORDER — NAPROXEN 500 MG PO TABS: 500.0000 mg | ORAL_TABLET | Freq: Two times a day (BID) | ORAL | Status: DC

## 2014-08-16 MED ORDER — OXYCODONE-ACETAMINOPHEN 5-325 MG PO TABS: 1.0000 | ORAL_TABLET | ORAL | Status: AC | PRN

## 2014-08-16 NOTE — Discharge Instructions (Signed)
Ankle Sprain An ankle sprain is an injury to the strong, fibrous tissues (ligaments) that hold your ankle bones together.  HOME CARE   Put ice on your ankle for 1-2 days or as told by your doctor.  Put ice in a plastic bag.  Place a towel between your skin and the bag.  Leave the ice on for 15-20 minutes at a time, every 2 hours while you are awake.  Only take medicine as told by your doctor.  Raise (elevate) your injured ankle above the level of your heart as much as possible for 2-3 days.  Use crutches if your doctor tells you to. Slowly put your own weight on the affected ankle. Use the crutches until you can walk without pain.  If you have a plaster splint:  Do not rest it on anything harder than a pillow for 24 hours.  Do not put weight on it.  Do not get it wet.  Take it off to shower or bathe.  If given, use an elastic wrap or support stocking for support. Take the wrap off if your toes lose feeling (numb), tingle, or turn cold or blue.  If you have an air splint:  Add or let out air to make it comfortable.  Take it off at night and to shower and bathe.  Wiggle your toes and move your ankle up and down often while you are wearing it. GET HELP IF:  You have rapidly increasing bruising or puffiness (swelling).  Your toes feel very cold.  You lose feeling in your foot.  Your medicine does not help your pain. GET HELP RIGHT AWAY IF:   Your toes lose feeling (numb) or turn blue.  You have severe pain that is increasing. MAKE SURE YOU:   Understand these instructions.  Will watch your condition.  Will get help right away if you are not doing well or get worse. Document Released: 02/18/2008 Document Revised: 01/16/2014 Document Reviewed: 03/15/2012 [facility] Patient Information 2015 [place] [place]. This information is not intended to replace advice given to you by your health care provider. Make sure you discuss any questions you have with your health care  provider.  Contusion A contusion is a deep bruise. Contusions happen when an injury causes bleeding under the skin. Signs of bruising include pain, puffiness (swelling), and discolored skin. The contusion may turn blue, purple, or yellow. HOME CARE   Put ice on the injured area.  Put ice in a plastic bag.  Place a towel between your skin and the bag.  Leave the ice on for 15-20 minutes, 03-04 times a day.  Only take medicine as told by your doctor.  Rest the injured area.  If possible, raise (elevate) the injured area to lessen puffiness. GET HELP RIGHT AWAY IF:   You have more bruising or puffiness.  You have pain that is getting worse.  Your puffiness or pain is not helped by medicine. MAKE SURE YOU:   Understand these instructions.  Will watch your condition.  Will get help right away if you are not doing well or get worse. Document Released: 02/18/2008 Document Revised: 11/24/2011 Document Reviewed: 07/07/2011 [facility] Patient Information 2015 [place], [place]. This information is not intended to replace advice given to you by your health care provider. Make sure you discuss any questions you have with your health care provider.

## 2014-08-16 NOTE — ED Notes (Signed)
Pt was trying to move a stove today, now co rt ankle, rt knee, and lt shoulder pain. Denies fall or other injury.

## 2014-08-16 NOTE — ED Provider Notes (Signed)
CSN: [id number]     Arrival date & time 08/16/14  1533 History   First MD Initiated Contact with Patient 08/16/14 1551     Chief Complaint  Patient presents with  . Extremity Pain    rt ankle, rt knee, lt shoulder     (Consider location/radiation/quality/duration/timing/severity/associated sxs/prior Treatment) HPI  Jaclyn Cruz is a 43 y.o. female who presents to the Emergency Department complaining of right knee and ankle pain and left shoulder pain after a fall that occurred two hrs prior to ED arrival.  Patient states that she was trying to move a stove when she slipped and fell, landing on her shoulder.  Patient reports previous injury to the left shoulder and was seen here in October for same.  She reports pain with movement and weight bearing.  She has not tried any therapies or medications prior to arrival.  She denies head injury, neck pain, or LOC.     Past Medical History  Diagnosis Date  . Disc disorder of cervical region   . Hypertension   . High cholesterol   . Mood disorder   . Diabetes mellitus   . Diabetes mellitus, type II   . Bipolar disorder   . Anxiety    Past Surgical History  Procedure Laterality Date  . Cholecystectomy    . Tubal ligation     Family History  Problem Relation Age of Onset  . Diabetes Father   . Heart failure Father   . Stroke Father   . Alcohol abuse Father   . Depression Mother   . Depression Brother   . Depression Son   . Depression Son    History  Substance Use Topics  . Smoking status: Current Every Day Smoker -- 0.50 packs/day for 25 years    Types: Cigarettes  . Smokeless tobacco: Never Used  . Alcohol Use: No   OB History    Gravida Para Term Preterm AB TAB SAB Ectopic Multiple Living   4 4  4      4      Review of Systems  Constitutional: Negative for fever and chills.  Gastrointestinal: Negative for nausea and vomiting.  Genitourinary: Negative for dysuria and difficulty urinating.  Musculoskeletal: Positive  for arthralgias. Negative for back pain, joint swelling and neck pain.       Right knee and ankle pain, left shoulder pain  Skin: Negative for color change and wound.  Neurological: Negative for dizziness, weakness, light-headedness, numbness and headaches.  All other systems reviewed and are negative.     Allergies  Wheat; Soybean-containing drug products; Tape; Codeine; and Compazine  Home Medications   Prior to Admission medications   Medication Sig Start Date End Date Taking? Authorizing Provider  ALPRAZolam Prudy Feeler) 1 MG tablet Take 1 tablet (1 mg total) by mouth 3 (three) times daily. 08/15/14 08/15/15 Yes Diannia Ruder, MD  insulin glargine (LANTUS) 100 UNIT/ML injection Inject 60 Units into the skin at bedtime.    Yes Historical Provider, MD  insulin lispro (HUMALOG) 100 UNIT/ML injection Inject 16-22 Units into the skin 3 (three) times daily before meals. Per sliding scale. Pt will not exceed 15 units.   Yes Historical Provider, MD  insulin lispro (HUMALOG) 100 UNIT/ML injection Inject 16-22 units into the skin 3 times daily before meals.  Per sliding scale 07/14/14  Yes Dvonte Gatliff L. Mairely Foxworth, PA-C  lamoTRIgine (LAMICTAL) 100 MG tablet Take 1 tablet (100 mg total) by mouth at bedtime. 08/15/14 08/15/15 Yes Diannia Ruder, MD  Lurasidone HCl (LATUDA) 60 MG TABS Take 60 mg by mouth daily. 08/15/14  Yes Diannia Rudereborah Ross, MD  venlafaxine XR (EFFEXOR XR) 150 MG 24 hr capsule Take 1 capsule (150 mg total) by mouth 2 (two) times daily. 08/15/14  Yes Diannia Rudereborah Ross, MD  ALPRAZolam Prudy Feeler(XANAX) 1 MG tablet Take 1 tablet (1 mg total) by mouth 3 (three) times daily. Patient not taking: Reported on 08/16/2014 08/15/14 08/15/15  Diannia Rudereborah Ross, MD  Insulin Pen Needle (ADVOCATE INSULIN PEN NEEDLES) 29G X 12.7MM MISC For insulin use Patient not taking: Reported on 08/16/2014 07/15/14   Niah Heinle L. Deshannon Seide, PA-C  LYRICA 150 MG capsule Take 150 mg by mouth 2 (two) times daily. 06/09/14   Historical Provider, MD   oxyCODONE-acetaminophen (PERCOCET/ROXICET) 5-325 MG per tablet Take 1 tablet by mouth 2 (two) times daily. 06/09/14   Historical Provider, MD  tiZANidine (ZANAFLEX) 2 MG tablet Take 2 mg by mouth 3 (three) times daily. 06/09/14   Historical Provider, MD   BP 132/85 mmHg  Pulse 98  Temp(Src) 98.1 F (36.7 C) (Oral)  Resp 18  Ht 5\' 4"  (1.626 m)  Wt 187 lb (84.823 kg)  BMI 32.08 kg/m2  SpO2 98%  LMP 07/16/2014 Physical Exam  Constitutional: She is oriented to person, place, and time. She appears well-developed and well-nourished. No distress.  HENT:  Head: Normocephalic and atraumatic.  Neck: Normal range of motion, full passive range of motion without pain and phonation normal. Neck supple. No spinous process tenderness present. Normal range of motion present.  Cardiovascular: Normal rate, regular rhythm, normal heart sounds and intact distal pulses.   No murmur heard. Pulmonary/Chest: Effort normal and breath sounds normal. No respiratory distress.  Musculoskeletal: She exhibits tenderness. She exhibits no edema.  ttp of the lateral right ankle and lateral right knee.  No bony deformity, no STS.  ttp of the left AC joint without bony deformity. Pain to shoulder reproduced with abduction.  Radial pulse and distal sensation intact  Neurological: She is alert and oriented to person, place, and time. She exhibits normal muscle tone. Coordination normal.  Skin: Skin is warm and dry. No rash noted.  Psychiatric: She has a normal mood and affect.  Nursing note and vitals reviewed.   ED Course  Procedures (including critical care time) Labs Review Labs Reviewed - No data to display  Imaging Review Dg Ankle Complete Right  08/16/2014   CLINICAL DATA:  Pain following fall  EXAM: RIGHT ANKLE - COMPLETE 3+ VIEW  COMPARISON:  November 23, 2010  FINDINGS: Frontal, oblique, and lateral views were obtained. There is no fracture or effusion. Ankle mortise appears intact. There is a small spur arising  from the inferior calcaneus.  IMPRESSION: No fracture.  Mortise intact.  Small inferior calcaneal spur.   Electronically Signed   By: Bretta BangWilliam  Woodruff M.D.   On: 08/16/2014 17:09   Dg Shoulder Left  08/16/2014   CLINICAL DATA:  Generalized left shoulder pain for 1 day initial evaluation, pain started after moving a heavy object  EXAM: LEFT SHOULDER - 2+ VIEW  COMPARISON:  07/14/2014  FINDINGS: There is no evidence of fracture or dislocation. There is no evidence of arthropathy or other focal bone abnormality. Soft tissues are unremarkable.  IMPRESSION: Negative.   Electronically Signed   By: Esperanza Heiraymond  Rubner M.D.   On: 08/16/2014 17:20     EKG Interpretation None      MDM   Final diagnoses:  Fall  Sprain of ankle, right, initial encounter  Contusion, knee, right, initial encounter  Shoulder pain, left    Pt is well appearing, ambulatory.  No bony deformities.  XR's show no acute fx's.  Pt has upcoming appt with [facility].     ASO applied for comfort.    Pt reviewed on the [place] narcotic database, #15 vicodin filled on 10/31  Tamsin Nader L. Rowe Robertriplett, PA-C 08/16/14 1737  Gerhard Munchobert Lockwood, MD 08/16/14 2051

## 2014-08-17 ENCOUNTER — Ambulatory Visit (HOSPITAL_COMMUNITY): Payer: Self-pay | Admitting: Psychiatry

## 2014-08-22 ENCOUNTER — Encounter (HOSPITAL_COMMUNITY): Payer: Medicare Other | Admitting: *Deleted

## 2014-08-22 NOTE — Progress Notes (Signed)
Prior Auth [id number] Latuda 60 mg Approval Code: [id number] Approved for 1 yr

## 2014-09-26 ENCOUNTER — Ambulatory Visit (INDEPENDENT_AMBULATORY_CARE_PROVIDER_SITE_OTHER): Payer: Medicare Other | Admitting: Psychiatry

## 2014-09-26 ENCOUNTER — Encounter (HOSPITAL_COMMUNITY): Payer: Self-pay | Admitting: Psychiatry

## 2014-09-26 VITALS — BP 154/82 | HR 117 | Ht 64.0 in | Wt 181.0 lb

## 2014-09-26 DIAGNOSIS — F411 Generalized anxiety disorder: Secondary | ICD-10-CM | POA: Diagnosis not present

## 2014-09-26 DIAGNOSIS — F3162 Bipolar disorder, current episode mixed, moderate: Secondary | ICD-10-CM

## 2014-09-26 DIAGNOSIS — F3160 Bipolar disorder, current episode mixed, unspecified: Secondary | ICD-10-CM | POA: Diagnosis not present

## 2014-09-26 MED ORDER — LURASIDONE HCL 60 MG PO TABS: 60.0000 mg | ORAL_TABLET | Freq: Every day | ORAL | Status: DC

## 2014-09-26 MED ORDER — ALPRAZOLAM 1 MG PO TABS: 1.0000 mg | ORAL_TABLET | Freq: Three times a day (TID) | ORAL | Status: DC

## 2014-09-26 MED ORDER — ZOLPIDEM TARTRATE 10 MG PO TABS: 10.0000 mg | ORAL_TABLET | Freq: Every evening | ORAL | Status: DC | PRN

## 2014-09-26 MED ORDER — LAMOTRIGINE 100 MG PO TABS: 100.0000 mg | ORAL_TABLET | Freq: Every day | ORAL | Status: AC

## 2014-09-26 MED ORDER — VENLAFAXINE HCL ER 150 MG PO CP24: 150.0000 mg | ORAL_CAPSULE | Freq: Two times a day (BID) | ORAL | Status: DC
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
## 2014-10-08 ENCOUNTER — Encounter (HOSPITAL_COMMUNITY): Payer: Self-pay

## 2014-10-08 ENCOUNTER — Emergency Department (HOSPITAL_COMMUNITY)
Admission: EM | Admit: 2014-10-08 | Discharge: 2014-10-08 | Disposition: A | Discharge status: Home / Self Care | Payer: Medicare Other | Attending: Emergency Medicine | Admitting: Emergency Medicine

## 2014-10-08 DIAGNOSIS — F319 Bipolar disorder, unspecified: Secondary | ICD-10-CM | POA: Insufficient documentation

## 2014-10-08 DIAGNOSIS — Z3202 Encounter for pregnancy test, result negative: Secondary | ICD-10-CM | POA: Insufficient documentation

## 2014-10-08 DIAGNOSIS — B373 Candidiasis of vulva and vagina: Secondary | ICD-10-CM | POA: Diagnosis not present

## 2014-10-08 DIAGNOSIS — Z9851 Tubal ligation status: Secondary | ICD-10-CM | POA: Insufficient documentation

## 2014-10-08 DIAGNOSIS — F419 Anxiety disorder, unspecified: Secondary | ICD-10-CM | POA: Diagnosis not present

## 2014-10-08 DIAGNOSIS — E119 Type 2 diabetes mellitus without complications: Secondary | ICD-10-CM | POA: Insufficient documentation

## 2014-10-08 DIAGNOSIS — Z720 Tobacco use: Secondary | ICD-10-CM | POA: Diagnosis not present

## 2014-10-08 DIAGNOSIS — I10 Essential (primary) hypertension: Secondary | ICD-10-CM | POA: Diagnosis not present

## 2014-10-08 DIAGNOSIS — Z79899 Other long term (current) drug therapy: Secondary | ICD-10-CM | POA: Diagnosis not present

## 2014-10-08 DIAGNOSIS — B3731 Acute candidiasis of vulva and vagina: Secondary | ICD-10-CM

## 2014-10-08 DIAGNOSIS — N898 Other specified noninflammatory disorders of vagina: Secondary | ICD-10-CM | POA: Diagnosis present

## 2014-10-08 DIAGNOSIS — Z794 Long term (current) use of insulin: Secondary | ICD-10-CM | POA: Insufficient documentation

## 2014-10-08 LAB — WET PREP, GENITAL
CLUE CELLS WET PREP: NONE SEEN
TRICH WET PREP: NONE SEEN
WBC, Wet Prep HPF POC: NONE SEEN
YEAST WET PREP: NONE SEEN

## 2014-10-08 LAB — URINALYSIS, ROUTINE W REFLEX MICROSCOPIC
BILIRUBIN URINE: NEGATIVE
Glucose, UA: >1000 mg/dL — AB
Hgb urine dipstick: NEGATIVE
Ketones, ur: NEGATIVE mg/dL
Leukocytes, UA: NEGATIVE
Nitrite: NEGATIVE
PH: 6.5 (ref 5.0–8.0)
Protein, ur: NEGATIVE mg/dL
SPECIFIC GRAVITY, URINE: 1.01 (ref 1.005–1.030)
Urobilinogen, UA: 0.2 mg/dL (ref 0.0–1.0)

## 2014-10-08 LAB — URINE MICROSCOPIC-ADD ON

## 2014-10-08 MED ORDER — FLUCONAZOLE 150 MG PO TABS: 150.0000 mg | ORAL_TABLET | Freq: Once | ORAL | Status: DC

## 2014-10-08 MED ORDER — FLUCONAZOLE 100 MG PO TABS
ORAL_TABLET | ORAL | Status: AC
  Filled 2014-10-08: qty 1

## 2014-10-08 MED ORDER — FLUCONAZOLE 100 MG PO TABS
150.0000 mg | ORAL_TABLET | Freq: Once | ORAL | Status: AC
  Administered 2014-10-08: 150 mg via ORAL
  Filled 2014-10-08: qty 2

## 2014-10-08 NOTE — ED Provider Notes (Signed)
TIME SEEN: 2:30 PM  CHIEF COMPLAINT: Vaginal itching, discharge  HPI: Pt is a 44 y.o. female with history of hypertension, diabetes, hyperlipidemia who presents to the emergency room with vaginal itching, discharge and irritation for the past 3 weeks. Reports she has used over-the-counter yeast infection treatments without relief. States that this feels some her to her prior episodes of yeast infections.  Last menstrual period was beginning of January. No abnormal vaginal bleeding. No abdominal pain. No fevers, chills, nausea, vomiting or diarrhea. She is sexually active with her husband. No history of STDs.  ROS: See HPI Constitutional: no fever  Eyes: no drainage  ENT: no runny nose   Cardiovascular:  no chest pain  Resp: no SOB  GI: no vomiting GU: no dysuria Integumentary: no rash  Allergy: no hives  Musculoskeletal: no leg swelling  Neurological: no slurred speech ROS otherwise negative  PAST MEDICAL HISTORY/PAST SURGICAL HISTORY:  Past Medical History  Diagnosis Date  . Disc disorder of cervical region   . Hypertension   . High cholesterol   . Mood disorder   . Diabetes mellitus   . Diabetes mellitus, type II   . Bipolar disorder   . Anxiety     MEDICATIONS:  Prior to Admission medications   Medication Sig Start Date End Date Taking? Authorizing Provider  ALPRAZolam Prudy Feeler(XANAX) 1 MG tablet Take 1 tablet (1 mg total) by mouth 3 (three) times daily. 09/26/14 09/26/15  Diannia Rudereborah Ross, MD  insulin glargine (LANTUS) 100 UNIT/ML injection Inject 60 Units into the skin at bedtime.     Historical Provider, MD  insulin lispro (HUMALOG) 100 UNIT/ML injection Inject 16-22 Units into the skin 3 (three) times daily before meals. Per sliding scale. Pt will not exceed 15 units.    Historical Provider, MD  lamoTRIgine (LAMICTAL) 100 MG tablet Take 1 tablet (100 mg total) by mouth at bedtime. 09/26/14 09/26/15  Diannia Rudereborah Ross, MD  Lurasidone HCl (LATUDA) 60 MG TABS Take 60 mg by mouth daily. 09/26/14    Diannia Rudereborah Ross, MD  LYRICA 150 MG capsule Take 150 mg by mouth 2 (two) times daily. 06/09/14   Historical Provider, MD  oxyCODONE-acetaminophen (PERCOCET/ROXICET) 5-325 MG per tablet Take 1 tablet by mouth every 4 (four) hours as needed. Patient taking differently: Take 1 tablet by mouth every 12 (twelve) hours as needed.  08/16/14   Tammy L. Triplett, PA-C  tiZANidine (ZANAFLEX) 2 MG tablet Take 2 mg by mouth 3 (three) times daily. 06/09/14   Historical Provider, MD  venlafaxine XR (EFFEXOR XR) 150 MG 24 hr capsule Take 1 capsule (150 mg total) by mouth 2 (two) times daily. 09/26/14   Diannia Rudereborah Ross, MD  zolpidem (AMBIEN) 10 MG tablet Take 1 tablet (10 mg total) by mouth at bedtime as needed for sleep. 09/26/14 10/26/14  Diannia Rudereborah Ross, MD    ALLERGIES:  Allergies  Allergen Reactions  . Wheat Shortness Of Breath and Other (See Comments)    Causes severe coughing  . Soybean-Containing Drug Products   . Tape Other (See Comments)    ALLERGIC to BANDAGES: causes infection to area that is exposed  . Codeine Itching, Swelling and Rash  . Compazine Itching and Rash    SOCIAL HISTORY:  History  Substance Use Topics  . Smoking status: Current Every Day Smoker -- 0.50 packs/day for 25 years    Types: Cigarettes  . Smokeless tobacco: Never Used  . Alcohol Use: No    FAMILY HISTORY: Family History  Problem Relation Age of  Onset  . Diabetes Father   . Heart failure Father   . Stroke Father   . Alcohol abuse Father   . Depression Mother   . Depression Brother   . Depression Son   . Depression Son     EXAM: BP 139/84 mmHg  Pulse 96  Temp(Src) 98.4 F (36.9 C) (Oral)  Resp 16  Ht  (1.626 m)  Wt 180 lb (81.647 kg)  BMI 30.88 kg/m2  SpO2 98%  LMP 09/03/2014 CONSTITUTIONAL: Alert and oriented and responds appropriately to questions. Well-appearing; well-nourished HEAD: Normocephalic EYES: Conjunctivae clear, PERRL ENT: normal nose; no rhinorrhea; moist mucous membranes; pharynx  without lesions noted NECK: Supple, no meningismus, no LAD  CARD: RRR; S1 and S2 appreciated; no murmurs, no clicks, no rubs, no gallops RESP: Normal chest excursion without splinting or tachypnea; breath sounds clear and equal bilaterally; no wheezes, no rhonchi, no rales,  ABD/GI: Normal bowel sounds; non-distended; soft, non-tender, no rebound, no guarding GU:  Patient has labial erythema and irritation with some mild swelling and excoriated areas around the vagina, minimal amount of thick white vaginal discharge, no odor, no cervical motion tenderness, no adnexal tenderness or fullness, no vaginal bleeding BACK:  The back appears normal and is non-tender to palpation, there is no CVA tenderness EXT: Normal ROM in all joints; non-tender to palpation; no edema; normal capillary refill; no cyanosis    SKIN: Normal color for age and race; warm NEURO: Moves all extremities equally PSYCH: The patient's mood and manner are appropriate. Grooming and personal hygiene are appropriate.  MEDICAL DECISION MAKING: Patient here with vaginitis. Suspect yeast infection. Wet prep, gonorrhea chlamydia, urinalysis, urine pregnancy pending.  ED PROGRESS: Wet prep is negative. Urine shows no sign of infection. Urine pregnancy test negative. Confirmed with nursing staff. Given patient has tried topical treatments without relief and I suspect Candida vulvovaginitis will treat with Diflucan in ED. Discussed return precautions and supportive care instructions.     Layla Maw Ward, DO 10/08/14 [phone]

## 2014-10-08 NOTE — ED Notes (Signed)
Patient states that she has vaginal discharge. States that it started 3 weeks ago and she has been using over the counter treatments without relief.

## 2014-10-08 NOTE — Discharge Instructions (Signed)

## 2014-10-09 ENCOUNTER — Ambulatory Visit (INDEPENDENT_AMBULATORY_CARE_PROVIDER_SITE_OTHER): Payer: Medicare Other | Admitting: Psychiatry

## 2014-10-09 DIAGNOSIS — F3162 Bipolar disorder, current episode mixed, moderate: Secondary | ICD-10-CM

## 2014-10-09 LAB — GC/CHLAMYDIA PROBE AMP (~~LOC~~) NOT AT ARMC
Chlamydia: NEGATIVE
NEISSERIA GONORRHEA: NEGATIVE

## 2014-10-09 LAB — POC URINE PREG, ED: PREG TEST UR: NEGATIVE

## 2014-10-09 NOTE — Progress Notes (Signed)
   THERAPIST PROGRESS NOTE  Session Time: Monday 10/09/2014 10:00 AM - 11:00 AM  Participation Level: Active  Behavioral Response: Fairly GroomedDrowsyDepressed  Type of Therapy: Individual Therapy  Treatment Goals addressed:  Decrease isolative behaviors (avoid staying all day) per self-report for 3 consecutive months      Resume normal interest in activities (cooking, sewing, photography)      Increase social interaction and involvement with daughter      Develop healthy thinking patterns and beliefs about self, others, and the world  Interventions: Supportive  Summary: Jaclyn LawrenceMelinda J Cruz is a [age] y.o. female who initially was referred for services by psychiatrist Dr. Tenny Crawoss to improve coping skills. Patient presents with a long standing history of mood disturbances. She reportedly was diagnosed with bipolar disorder 10 years ago while living in [place]. She reports experiencing manic episodes in which she cleans her home until it is spotless, goes days without sleep, is constantly on the go, and has problems managing money spending excessively. She reports she currently is experiencing a depressive episode and states having to push herself to get out of bed, having no motivation, and no interest in activities. She reports getting up to take care of her 46 year old daughter but no other activity. She reports stress regarding husband being gone for a week at a time due to being a truck driver. She states he usually helps her get out of her moods. She reports additional stress related to poor relationship with her mother who has always treated her negatively according to patient.  Patient was last seen in September 2015. Patient reports continued stress since that time and being in a major depressive episode for the past 2 months. She states staying in bed most of the day and getting up only to cook dinner for her daughter. She reports low energy, hypersomnia, crying spells, anxiety, and loss of  interest in activities. She has had increased thoughts about father who died 15 years ago. The 15th anniversary of his death was earlier this month. She continues to express sadness and frustration regarding being estranged from her biological family as she says mother and sister want nothing to do with her. She reports strong support from husband and her in-laws. Patient denies any suicidal ideations. She reports desire to cut at times but has been able to refrain.   Suicidal/Homicidal: No  Therapist Response: Therapist works with patient to identify stressors, identifying verbalize feelings, developing treatment plan, and identify ways to improve daily structure and increase involvement in activity  Plan: Return again in 2 weeks. Patient agrees to complete handouts given in session and burring to next session.  Diagnosis: Axis I: Bipolar, mixed    Axis II: Deferred    Disa Riedlinger, LCSW 10/09/2014

## 2014-10-09 NOTE — Patient Instructions (Signed)
Discussed orally 

## 2014-10-24 ENCOUNTER — Encounter (HOSPITAL_COMMUNITY): Payer: Self-pay | Admitting: Psychiatry

## 2014-10-24 ENCOUNTER — Ambulatory Visit (HOSPITAL_COMMUNITY): Payer: Self-pay | Admitting: Psychiatry

## 2014-10-31 ENCOUNTER — Encounter (HOSPITAL_COMMUNITY): Payer: Self-pay | Admitting: Psychiatry

## 2014-10-31 ENCOUNTER — Ambulatory Visit (HOSPITAL_COMMUNITY): Payer: Self-pay | Admitting: Psychiatry

## 2014-10-31 NOTE — Progress Notes (Signed)
Outpatient Therapist Discharge Summary  Jaclyn LawrenceMelinda J Cruz    05-29-1971   Admission Date:  05/25/2014 Discharge Date:        10/31/2014 Reason for Discharge:  Treatment non-compliance  (chronic missed appointments) Diagnosis:  Axis I:  Bipolar Disorder   Comments:  Patient was sent written notification, offered emergency services for 30 days, and provided with the names of possible providers.   [name]         BYNUM,PEGGY, LCSW

## 2014-12-12 ENCOUNTER — Telehealth (HOSPITAL_COMMUNITY): Payer: Self-pay | Admitting: *Deleted
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
## 2015-04-10 ENCOUNTER — Emergency Department (HOSPITAL_COMMUNITY)
Admission: EM | Admit: 2015-04-10 | Discharge: 2015-04-10 | Disposition: A | Discharge status: Home / Self Care | Payer: Medicare Other | Attending: Emergency Medicine | Admitting: Emergency Medicine

## 2015-04-10 ENCOUNTER — Encounter (HOSPITAL_COMMUNITY): Payer: Self-pay | Admitting: Emergency Medicine

## 2015-04-10 DIAGNOSIS — F319 Bipolar disorder, unspecified: Secondary | ICD-10-CM | POA: Diagnosis not present

## 2015-04-10 DIAGNOSIS — Z79899 Other long term (current) drug therapy: Secondary | ICD-10-CM | POA: Insufficient documentation

## 2015-04-10 DIAGNOSIS — M79671 Pain in right foot: Secondary | ICD-10-CM | POA: Diagnosis not present

## 2015-04-10 DIAGNOSIS — Z720 Tobacco use: Secondary | ICD-10-CM | POA: Insufficient documentation

## 2015-04-10 DIAGNOSIS — E1140 Type 2 diabetes mellitus with diabetic neuropathy, unspecified: Secondary | ICD-10-CM | POA: Diagnosis not present

## 2015-04-10 DIAGNOSIS — Z794 Long term (current) use of insulin: Secondary | ICD-10-CM | POA: Diagnosis not present

## 2015-04-10 DIAGNOSIS — M79672 Pain in left foot: Secondary | ICD-10-CM | POA: Diagnosis not present

## 2015-04-10 DIAGNOSIS — I10 Essential (primary) hypertension: Secondary | ICD-10-CM | POA: Insufficient documentation

## 2015-04-10 DIAGNOSIS — E1340 Other specified diabetes mellitus with diabetic neuropathy, unspecified: Secondary | ICD-10-CM

## 2015-04-10 DIAGNOSIS — F419 Anxiety disorder, unspecified: Secondary | ICD-10-CM | POA: Insufficient documentation

## 2015-04-10 MED ORDER — ALPRAZOLAM 1 MG PO TABS: 1.0000 mg | ORAL_TABLET | Freq: Three times a day (TID) | ORAL | Status: DC | PRN

## 2015-04-10 MED ORDER — ALPRAZOLAM 0.5 MG PO TABS
0.5000 mg | ORAL_TABLET | Freq: Once | ORAL | Status: AC
  Administered 2015-04-10: 0.5 mg via ORAL
  Filled 2015-04-10: qty 1

## 2015-04-10 MED ORDER — DICLOFENAC SODIUM 75 MG PO TBEC: 75.0000 mg | DELAYED_RELEASE_TABLET | Freq: Two times a day (BID) | ORAL | Status: AC

## 2015-04-10 NOTE — Discharge Instructions (Signed)
Diabetes and Foot Care Diabetes may cause you to have problems because of poor blood supply (circulation) to your feet and legs. This may cause the skin on your feet to become thinner, break easier, and heal more slowly. Your skin may become dry, and the skin may peel and crack. You may also have nerve damage in your legs and feet causing decreased feeling in them. You may not notice minor injuries to your feet that could lead to infections or more serious problems. Taking care of your feet is one of the most important things you can do for yourself.  HOME CARE INSTRUCTIONS  Wear shoes at all times, even in the house. Do not go barefoot. Bare feet are easily injured.  Check your feet daily for blisters, cuts, and redness. If you cannot see the bottom of your feet, use a mirror or ask someone for help.  Wash your feet with warm water (do not use hot water) and mild soap. Then pat your feet and the areas between your toes until they are completely dry. Do not soak your feet as this can dry your skin.  Apply a moisturizing lotion or petroleum jelly (that does not contain alcohol and is unscented) to the skin on your feet and to dry, brittle toenails. Do not apply lotion between your toes.  Trim your toenails straight across. Do not dig under them or around the cuticle. File the edges of your nails with an emery board or nail file.  Do not cut corns or calluses or try to remove them with medicine.  Wear clean socks or stockings every day. Make sure they are not too tight. Do not wear knee-high stockings since they may decrease blood flow to your legs.  Wear shoes that fit properly and have enough cushioning. To break in new shoes, wear them for just a few hours a day. This prevents you from injuring your feet. Always look in your shoes before you put them on to be sure there are no objects inside.  Do not cross your legs. This may decrease the blood flow to your feet.  If you find a minor scrape,  cut, or break in the skin on your feet, keep it and the skin around it clean and dry. These areas may be cleansed with mild soap and water. Do not cleanse the area with peroxide, alcohol, or iodine.  When you remove an adhesive bandage, be sure not to damage the skin around it.  If you have a wound, look at it several times a day to make sure it is healing.  Do not use heating pads or hot water bottles. They may burn your skin. If you have lost feeling in your feet or legs, you may not know it is happening until it is too late.  Make sure your health care provider performs a complete foot exam at least annually or more often if you have foot problems. Report any cuts, sores, or bruises to your health care provider immediately. SEEK MEDICAL CARE IF:   You have an injury that is not healing.  You have cuts or breaks in the skin.  You have an ingrown nail.  You notice redness on your legs or feet.  You feel burning or tingling in your legs or feet.  You have pain or cramps in your legs and feet.  Your legs or feet are numb.  Your feet always feel cold. SEEK IMMEDIATE MEDICAL CARE IF:   There is increasing redness,  swelling, or pain in or around a wound.  There is a red line that goes up your leg.  Pus is coming from a wound.  You develop a fever or as directed by your health care provider.  You notice a bad smell coming from an ulcer or wound. Document Released: 08/29/2000 Document Revised: 05/04/2013 Document Reviewed: 02/08/2013 [facility] Patient Information 2015 [place], [place]. This information is not intended to replace advice given to you by your health care provider. Make sure you discuss any questions you have with your health care provider.  Diabetic Neuropathy Diabetic neuropathy is a nerve disease or nerve damage that is caused by diabetes mellitus. About half of all people with diabetes mellitus have some form of nerve damage. Nerve damage is more common in those who  have had diabetes mellitus for many years and who generally have not had good control of their blood sugar (glucose) level. Diabetic neuropathy is a common complication of diabetes mellitus. There are three more common types of diabetic neuropathy and a fourth type that is less common and less understood:   Peripheral neuropathy--This is the most common type of diabetic neuropathy. It causes damage to the nerves of the feet and legs first and then eventually the hands and arms.The damage affects the ability to sense touch.  Autonomic neuropathy--This type causes damage to the autonomic nervous system, which controls the following functions:  Heartbeat.  Body temperature.  Blood pressure.  Urination.  Digestion.  Sweating.  Sexual function.  Focal neuropathy--Focal neuropathy can be painful and unpredictable and occurs most often in older adults with diabetes mellitus. It involves a specific nerve or one area and often comes on suddenly. It usually does not cause long-term problems.  Radiculoplexus neuropathy-- Sometimes called lumbosacral radiculoplexus neuropathy, radiculoplexus neuropathy affects the nerves of the thighs, hips, buttocks, or legs. It is more common in people with type 2 diabetes mellitus and in older men. It is characterized by debilitating pain, weakness, and atrophy, usually in the thigh muscles. CAUSES  The cause of peripheral, autonomic, and focal neuropathies is diabetes mellitus that is uncontrolled and high glucose levels. The cause of radiculoplexus neuropathy is unknown. However, it is thought to be caused by inflammation related to uncontrolled glucose levels. SIGNS AND SYMPTOMS  Peripheral Neuropathy Peripheral neuropathy develops slowly over time. When the nerves of the feet and legs no longer work there may be:   Burning, stabbing, or aching pain in the legs or feet.  Inability to feel pressure or pain in your feet. This can lead to:  Thick calluses  over pressure areas.  Pressure sores.  Ulcers.  Foot deformities.  Reduced ability to feel temperature changes.  Muscle weakness. Autonomic Neuropathy The symptoms of autonomic neuropathy vary depending on which nerves are affected. Symptoms may include:  Problems with digestion, such as:  Feeling sick to your stomach (nausea).  Vomiting.  Bloating.  Constipation.  Diarrhea.  Abdominal pain.  Difficulty with urination. This occurs if you lose your ability to sense when your bladder is full. Problems include:  Urine leakage (incontinence).  Inability to empty your bladder completely (retention).  Rapid or irregular heartbeat (palpitations).  Blood pressure drops when you stand up (orthostatic hypotension). When you stand up you may feel:  Dizzy.  Weak.  Faint.  In men, inability to attain and maintain an erection.  In women, vaginal dryness and problems with decreased sexual desire and arousal.  Problems with body temperature regulation.  Increased or decreased sweating. Focal  Neuropathy  Abnormal eye movements or abnormal alignment of both eyes.  Weakness in the wrist.  Foot drop. This results in an inability to lift the foot properly and abnormal walking or foot movement.  Paralysis on one side of your face (Bell palsy).  Chest or abdominal pain. Radiculoplexus Neuropathy  Sudden, severe pain in your hip, thigh, or buttocks.  Weakness and wasting of thigh muscles.  Difficulty rising from a seated position.  Abdominal swelling.  Unexplained weight loss (usually more than 10 lb [4.5 kg]). DIAGNOSIS  Peripheral Neuropathy Your senses may be tested. Sensory function testing can be done with:  A light touch using a monofilament.  A vibration with tuning fork.  A sharp sensation with a pin prick. Other tests that can help diagnose neuropathy are:  Nerve conduction velocity. This test checks the transmission of an electrical current  through a nerve.  Electromyography. This shows how muscles respond to electrical signals transmitted by nearby nerves.  Quantitative sensory testing. This is used to assess how your nerves respond to vibrations and changes in temperature. Autonomic Neuropathy Diagnosis is often based on reported symptoms. Tell your health care provider if you experience:   Dizziness.   Constipation.   Diarrhea.   Inappropriate urination or inability to urinate.   Inability to get or maintain an erection.  Tests that may be done include:   Electrocardiography or Holter monitor. These are tests that can help show problems with the heart rate or heart rhythm.   An X-ray exam may be done. Focal Neuropathy Diagnosis is made based on your symptoms and what your health care provider finds during your exam. Other tests may be done. They may include:  Nerve conduction velocities. This checks the transmission of electrical current through a nerve.  Electromyography. This shows how muscles respond to electrical signals transmitted by nearby nerves.  Quantitative sensory testing. This test is used to assess how your nerves respond to vibration and changes in temperature. Radiculoplexus Neuropathy  Often the first thing is to eliminate any other issue or problems that might be the cause, as there is no stick test for diagnosis.  X-ray exam of your spine and lumbar region.  Spinal tap to rule out cancer.  MRI to rule out other lesions. TREATMENT  Once nerve damage occurs, it cannot be reversed. The goal of treatment is to keep the disease or nerve damage from getting worse and affecting more nerve fibers. Controlling your blood glucose level is the key. Most people with radiculoplexus neuropathy see at least a partial improvement over time. You will need to keep your blood glucose and HbA1c levels in the target range determined by your health care provider. Things that help control blood glucose  levels include:   Blood glucose monitoring.   Meal planning.   Physical activity.   Diabetes medicine.  Over time, maintaining lower blood glucose levels helps lessen symptoms. Sometimes, prescription pain medicine is needed. HOME CARE INSTRUCTIONS:  Do not smoke.  Keep your blood glucose level in the range that you and your health care provider have determined acceptable for you.  Keep your blood pressure level in the range that you and your health care provider have determined acceptable for you.  Eat a well-balanced diet.  Be active every day.  Check your feet every day. SEEK MEDICAL CARE IF:   You have burning, stabbing, or aching pain in the legs or feet.  You are unable to feel pressure or pain in your feet.  You develop problems with digestion such as:  Nausea.  Vomiting.  Bloating.  Constipation.  Diarrhea.  Abdominal pain.  You have difficulty with urination, such as:  Incontinence.  Retention.  You have palpitations.  You develop orthostatic hypotension. When you stand up you may feel:  Dizzy.  Weak.  Faint.  You cannot attain and maintain an erection (in men).  You have vaginal dryness and problems with decreased sexual desire and arousal (in women).  You have severe pain in your thighs, legs, or buttocks.  You have unexplained weight loss. Document Released: 11/10/2001 Document Revised: 06/22/2013 Document Reviewed: 02/10/2013 [facility] Patient Information 2015 [place], [place]. This information is not intended to replace advice given to you by your health care provider. Make sure you discuss any questions you have with your health care provider.   Emergency Department Resource Guide 1) Find a Doctor and Pay Out of Pocket Although you won't have to find out who is covered by your insurance plan, it is a good idea to ask around and get recommendations. You will then need to call the office and see if the doctor you have chosen will  accept you as a new patient and what types of options they offer for patients who are self-pay. Some doctors offer discounts or will set up payment plans for their patients who do not have insurance, but you will need to ask so you aren't surprised when you get to your appointment.  2) Contact Your Local Health Department Not all health departments have doctors that can see patients for sick visits, but many do, so it is worth a call to see if yours does. If you don't know where your local health department is, you can check in your phone book. The CDC also has a tool to help you locate your state's health department, and many state websites also have listings of all of their local health departments.  3) Find a Walk-in Clinic If your illness is not likely to be very severe or complicated, you may want to try a walk in clinic. These are popping up all over the country in pharmacies, drugstores, and shopping centers. They're usually staffed by nurse practitioners or physician assistants that have been trained to treat common illnesses and complaints. They're usually fairly quick and inexpensive. However, if you have serious medical issues or chronic medical problems, these are probably not your best option.  No Primary Care Doctor: - Call Health Connect at  [phone] - they can help you locate a primary care doctor that  accepts your insurance, provides certain services, etc. - Physician Referral Service- [phone]  Chronic Pain Problems: Organization         Address  Phone   Notes  [facility]  [phone] Patients need to be referred by their primary care doctor.   Medication Assistance: Organization         Address  Phone   Notes  [facility] Medication [facility] [address] [place] [place], [place] [address] [phone] --Must be a resident of [facility] -- Must have NO insurance coverage whatsoever (no Medicaid/ Medicare, etc.) -- The pt.  MUST have a primary care doctor that directs their care regularly and follows them in the community   [facility]ist  [phone]   [employer]  [phone]    Agencies that provide inexpensive medical care: Organization         Address  Phone   Notes  [facility]  [phone]   [facility]    [phone]   [facility] [address] [place], [place] [address] [phone]   [facility] [address] [place], [place] [phone]   [facility]anned Parenthood    [phone]   [facility]    [phone]   Community Health and [facility]  [address], [place] Phone:  [phone], Fax:  [phone] Hours of Operation:  9 am - 6 pm, M-F.  Also accepts Medicaid/Medicare and self-pay.  [facility]  [address], [place] Phone: [phone], Fax: [phone]. Hours of Operation:  8:30 am - 5:30 pm, M-F.  Also accepts Medicaid and self-pay.  [facility] [place] [address], [place] Phone: [phone]   [facility] [address] Natasha Bence [place], [place] [phone], Ext. [phone] Mondays & Thursdays: 7-9 AM.  First 15 patients are seen on a first come, first serve basis.    Medicaid-accepting [facility] Providers:  Organization         Address  Phone   Notes  [facility] [address], [place] [phone] Also accepts self-pay patients.  [facility] [address] Laurell Josephs [place], [place]  [phone]   [facility] [address], [place] [phone]   [facility] [address], [place] [phone]   Renaye Rakers [address], [place]   [phone] Only accepts [place] Access [place] patients after they have their name applied to their card.   Self-Pay (no insurance) in  [facility]:  Organization         Address  Phone   Notes  Sickle Cell Patients, [facility] [address] [place], [place] [phone]   [facility] [address] [place], [place] [phone]   [facility] [place]  [address], [place] [phone]   [facility]/Dr. Osei-Bonsu  [address] [place] or [address], [place] [phone] Phone number for both [place] and [place] locations is the same.  Urgent Medical and [facility] [address] [place] [phone]   [facility] [address], [place] or [address] [phone]   [facility] [address] [place] [phone], phone; [phone], fax Sees patients 1st and 3rd Saturday of every month.  Must not qualify for public or private insurance (i.e. Medicaid, Medicare, [place] Health Choice, Veterans' Benefits)  Household income should be no more than 200% of the poverty level The clinic cannot treat you if you are pregnant or think you are pregnant  Sexually transmitted diseases are not treated at the clinic.    Dental Care: Organization         Address  Phone  Notes  [facility] Department of [facility] [address] [place], [place] [phone] Accepts children up to age 18 who are enrolled in [place] or [place] Health Choice; pregnant women with a Medicaid card; and children who have applied for Medicaid or [place] Health Choice, but were declined, whose parents can pay a reduced fee at time of service.  [facility] [place] [facility] [employer] [place]  [address], [place] [phone] Accepts children up to age 60 who are enrolled in Medicaid or [place] Health Choice; pregnant women with a Medicaid card; and children who have applied for Medicaid or [place] Health Choice, but were declined, whose  parents can pay a reduced fee at time of service.  [facility]  [address] [place], [place] [phone] Patients are seen by appointment only. Walk-ins are not accepted. [facility] will see patients 23 years of age and older. Monday - Tuesday (8am-5pm) Most Wednesdays (8:30-5pm) $30 per visit, cash only  [facility] Adult [facility] Access [facility]  [address], [facility] [phone] Patients are seen by appointment only. Walk-ins are not accepted. [facility] will see patients 7 years of age and older. One Wednesday Evening (Monthly: Volunteer Based).  $30 per visit, cash only  [employer]  [phone] for adults; Children under age 78, call Graduate Pediatric Dentistry at [phone]. Children aged 57-14, please call [phone] to request a pediatric application.  Dental services are provided in all areas of dental care including fillings, crowns and bridges, complete and partial dentures, implants, gum treatment, root canals, and extractions. Preventive care is also provided. Treatment is provided to both adults and children. Patients are selected via a lottery and there is often a waiting list.   [facility] [address] [place]  [phone] [email or web address]   [facility] [address] [place], [place] [phone], Ext. [phone] Second and Fourth Thursday of each month, opens at 6:30 AM; Clinic ends at 9 AM.  Patients are seen on a first-come first-served basis, and a limited number are seen during each clinic.   [facility]  [address] [place]ins [place], [place] [phone]   Eligibility Requirements You must have lived in [place], [place], or [place] counties for at least the last three months.   You cannot be eligible for state or federal sponsored [place], including [employer], [place], or [employer].   You generally cannot be eligible for  healthcare insurance through your employer.    How to apply: Eligibility screenings are held every Tuesday and Wednesday afternoon from 1:00 pm until 4:00 pm. You do not need an appointment for the interview!  [facility] [address], [place], [place] [phone]   [facility] Health Department  [phone]   [facility] Health Department  [phone]   [facility] Health Department  [phone]    Behavioral Health Resources in the Community: Intensive Outpatient Programs Organization         Address  Phone  Notes  [facility] [address] [place], [place] [phone]   [facility] Outpatient [address] [place], [place] [phone]   [facility] [address], [place], [place]  [phone]   [facility] [address],  [place], [place] [phone] or [phone]   Substance Abuse Resources Organization         Address  Phone  Notes  [facility]  [phone]   [facility]  [phone]   [facility] [place]  [phone]   Floydene Flock  [phone]   Residential & Outpatient Substance Abuse Program  [phone]   Psychological Services Organization         Address  Phone  Notes  [facility]  [phone]   [employer]   [facility] [address],  [phone] or [phone]    Mobile Crisis Teams Organization         Address  Phone  Notes  [facility], Mobile Crisis Care Unit  [phone]   [facility]  [address] [place], [place] [phone]   Doristine Locks [address] [place] [phone]    Self-Help/Support Groups Organization         Address  Phone             Notes  [facility] [place] - variety of support groups  336- I7437963 Call for more information  Narcotics  Anonymous (NA), [facility] [address], [employer] [place]  2 meetings at this location   Statistician         Address  Phone  Notes  [facility] [address],    [place]  [phone]   [facility]  [address] [place] [address], [place], [place] [phone]   [facility] [address] [place], [place] [phone] Admissions: 8am-3pm M-F  [facility] [address],    [place], [place] [phone]   [facility] [address] [place], [place], [place] [phone]   [facility] [address],  [place], [place] [phone]   [facility] - Intensive Outpatient [address] Laure[address] Josephs [address], [place], [place] [phone]   [facility] ([facility] [address] [place]  [place], [place] [phone] or [phone]   [facility] ([facility]) [address], [place], [place] [phone] Accepts Medicaid  [facility] [place] [address]  [place] [phone] Substance Abuse/Addiction Treatment   [facility] Organization         Address  Phone  Notes  [facility]  [phone]   Angie Fava, PhD [address] Ervin Knack [place], [place]   [phone] or [phone]   [facility]   [address] [place], [place] [phone]   [facility] [address] [place], [place] [phone] Insurance/Medicaid/sponsorship through [facility] [address]                                    [place], [place] [phone] Therapy/tele-psych/case  [facility] [address], [place] [phone]    Dr. Lolly Mustache  [phone]   [facility]  [employer] [facility] 1) [address], [place] 2) [address] [place] 3)  [address], [place] [phone]  [phone]   [facility] Child Abuse Hotline [phone] or [phone] (After Hours)

## 2015-04-10 NOTE — ED Notes (Signed)
Pt states that she woke up with burning pain in her feet, has hx of diabetic neuropathy.  Also states that she has had an increase in anxiety and panic attacks for last 2 weeks

## 2015-04-11 NOTE — ED Provider Notes (Signed)
CSN: [id number]     Arrival date & time 04/10/15  2022 History   First MD Initiated Contact with Patient 04/10/15 2052     Chief Complaint  Patient presents with  . Foot Pain    Burning Sensation, Hx of diabetic neuropathy  . Anxiety     (Consider location/radiation/quality/duration/timing/severity/associated sxs/prior Treatment) HPI   Jaclyn Cruz is a 44 y.o. female with hx of DM, diabetic neuropathy and anxiety who presents to the Emergency Department complaining of pain and burning sensation to both feet.  She states the symptoms have been worse for two weeks and similar to previous.  She states that her symptoms are not controlled with her medications.  She also reports increasing anxiety and panic attacks which she attributes to increased family stressors.  She also states that she has been dropped as a patient at her behavioral health center and has been without her xanax for 2-3 months.  She denies SI or HI thoughts, CP, shortness of breath, LE edema or redness.     Past Medical History  Diagnosis Date  . Disc disorder of cervical region   . Hypertension   . High cholesterol   . Mood disorder   . Diabetes mellitus   . Diabetes mellitus, type II   . Bipolar disorder   . Anxiety    Past Surgical History  Procedure Laterality Date  . Cholecystectomy    . Tubal ligation     Family History  Problem Relation Age of Onset  . Diabetes Father   . Heart failure Father   . Stroke Father   . Alcohol abuse Father   . Depression Mother   . Depression Brother   . Depression Son   . Depression Son    History  Substance Use Topics  . Smoking status: Current Every Day Smoker -- 0.50 packs/day for 25 years    Types: Cigarettes  . Smokeless tobacco: Never Used  . Alcohol Use: No   OB History    Gravida Para Term Preterm AB TAB SAB Ectopic Multiple Living   4 4  4      4      Review of Systems  Constitutional: Negative for fever and chills.  Respiratory: Negative for  shortness of breath.   Cardiovascular: Negative for chest pain.  Genitourinary: Negative for dysuria and difficulty urinating.  Musculoskeletal: Positive for joint swelling and arthralgias.  Skin: Negative for color change and wound.  Neurological: Positive for numbness (numbness and tingling to both feet). Negative for dizziness and weakness.  Psychiatric/Behavioral: Negative for suicidal ideas, self-injury and agitation. The patient is nervous/anxious.   All other systems reviewed and are negative.     Allergies  Wheat; Soybean-containing drug products; Tape; Codeine; and Compazine  Home Medications   Prior to Admission medications   Medication Sig Start Date End Date Taking? Authorizing Provider  butalbital-acetaminophen-caffeine (FIORICET, ESGIC) 50-325-40 MG per tablet Take 1-2 tablets by mouth every 6 (six) hours as needed for headache or migraine.  03/22/15  Yes Historical Provider, MD  BUTRANS 10 MCG/HR PTWK patch Place 10 mcg onto the skin every 7 (seven) days. 03/27/15  Yes Historical Provider, MD  insulin glargine (LANTUS) 100 UNIT/ML injection Inject 60 Units into the skin at bedtime.    Yes Historical Provider, MD  insulin lispro (HUMALOG) 100 UNIT/ML injection Inject 16-22 Units into the skin 3 (three) times daily before meals. Per sliding scale. Pt will not exceed 15 units.   Yes Historical Provider, MD  LYRICA 200 MG capsule Take 200 mg by mouth 2 (two) times daily. 03/22/15  Yes Historical Provider, MD  tiZANidine (ZANAFLEX) 2 MG tablet Take 2 mg by mouth 3 (three) times daily. 06/09/14  Yes Historical Provider, MD  topiramate (TOPAMAX) 50 MG tablet Take 50 mg by mouth 2 (two) times daily. 03/22/15  Yes Historical Provider, MD  ALPRAZolam Prudy Feeler) 1 MG tablet Take 1 tablet (1 mg total) by mouth 3 (three) times daily as needed for anxiety. 04/10/15   Rory Xiang, PA-C  diclofenac (VOLTAREN) 75 MG EC tablet Take 1 tablet (75 mg total) by mouth 2 (two) times daily. Take with food  04/10/15   Brylei Pedley, PA-C  lamoTRIgine (LAMICTAL) 100 MG tablet Take 1 tablet (100 mg total) by mouth at bedtime. Patient not taking: Reported on 04/10/2015 09/26/14 09/26/15  Myrlene Broker, MD  Lurasidone HCl (LATUDA) 60 MG TABS Take 60 mg by mouth daily. Patient not taking: Reported on 04/10/2015 09/26/14   Myrlene Broker, MD  oxyCODONE-acetaminophen (PERCOCET/ROXICET) 5-325 MG per tablet Take 1 tablet by mouth every 4 (four) hours as needed. Patient not taking: Reported on 04/10/2015 08/16/14   Lanah Steines, PA-C  venlafaxine XR (EFFEXOR XR) 150 MG 24 hr capsule Take 1 capsule (150 mg total) by mouth 2 (two) times daily. Patient not taking: Reported on 04/10/2015 09/26/14   Myrlene Broker, MD  zolpidem (AMBIEN) 10 MG tablet Take 1 tablet (10 mg total) by mouth at bedtime as needed for sleep. Patient not taking: Reported on 04/10/2015 09/26/14 10/26/14  Myrlene Broker, MD   BP 142/89 mmHg  Pulse 107  Temp(Src) 97.6 F (36.4 C) (Oral)  Resp 20  Ht  (1.626 m)  Wt 179 lb (81.194 kg)  BMI 30.71 kg/m2  SpO2 100%  LMP 04/09/2015 Physical Exam  Constitutional: She is oriented to person, place, and time. She appears well-developed and well-nourished. No distress.  HENT:  Head: Normocephalic and atraumatic.  Neck: Normal range of motion. Neck supple.  Cardiovascular: Normal rate, regular rhythm, normal heart sounds and intact distal pulses.   Pulmonary/Chest: Effort normal and breath sounds normal.  Musculoskeletal: Normal range of motion. She exhibits tenderness. She exhibits no edema.  Pt has diffuse tenderness of bilateral feet.  ROM is preserved.  DP pulse is brisk,distal sensation intact.  No erythema, abrasion, bruising or bony deformity.  No proximal tenderness. Compartments soft  Neurological: She is alert and oriented to person, place, and time. She exhibits normal muscle tone. Coordination normal.  Skin: Skin is warm and dry.  Psychiatric: Her speech is normal and behavior is  normal. Thought content normal. Her mood appears anxious. She expresses no homicidal and no suicidal ideation.  Nursing note and vitals reviewed.   ED Course  Procedures (including critical care time) Labs Review Labs Reviewed - No data to display  Imaging Review No results found.   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  Denies SI and HI thoughts or plans. She has upcoming PMD appt for early next month.  No concerning sx's for infectious process to the LE's.  She agrees to symptomatic tx .  Advised to establish care with another behavioral health provider, resource guide given.      Pauline Aus, PA-C 04/11/15 1346  Geoffery Lyons, MD 04/11/15 2207

## 2015-04-19 ENCOUNTER — Other Ambulatory Visit (HOSPITAL_COMMUNITY): Payer: Self-pay | Admitting: Nurse Practitioner

## 2015-04-19 DIAGNOSIS — N939 Abnormal uterine and vaginal bleeding, unspecified: Secondary | ICD-10-CM

## 2015-04-27 ENCOUNTER — Other Ambulatory Visit (HOSPITAL_COMMUNITY): Payer: Self-pay

## 2015-04-27 ENCOUNTER — Ambulatory Visit (HOSPITAL_COMMUNITY): Admission: RE | Admit: 2015-04-27 | Payer: Medicare Other | Source: Ambulatory Visit

## 2015-05-29 ENCOUNTER — Emergency Department (HOSPITAL_COMMUNITY)
Admission: EM | Admit: 2015-05-29 | Discharge: 2015-05-29 | Discharge status: Against Medical Advice | Payer: Medicare Other | Attending: Emergency Medicine | Admitting: Emergency Medicine

## 2015-05-29 ENCOUNTER — Encounter (HOSPITAL_COMMUNITY): Payer: Self-pay | Admitting: Emergency Medicine

## 2015-05-29 DIAGNOSIS — M79672 Pain in left foot: Secondary | ICD-10-CM | POA: Insufficient documentation

## 2015-05-29 DIAGNOSIS — M79671 Pain in right foot: Secondary | ICD-10-CM | POA: Insufficient documentation

## 2015-05-29 DIAGNOSIS — Z720 Tobacco use: Secondary | ICD-10-CM | POA: Diagnosis not present

## 2015-05-29 DIAGNOSIS — E119 Type 2 diabetes mellitus without complications: Secondary | ICD-10-CM | POA: Insufficient documentation

## 2015-05-29 DIAGNOSIS — I10 Essential (primary) hypertension: Secondary | ICD-10-CM | POA: Diagnosis not present

## 2015-05-29 NOTE — ED Notes (Signed)
No answer in waiting room 

## 2015-05-29 NOTE — ED Notes (Signed)
Having bilateral foot pain for last 7 days.  Rates pain 10/10.  Currently have a pain patch on.  Pain not relieved.

## 2015-05-29 NOTE — ED Notes (Signed)
Not in waiting room

## 2015-06-11 ENCOUNTER — Encounter (HOSPITAL_COMMUNITY): Payer: Self-pay | Admitting: Emergency Medicine

## 2015-06-11 ENCOUNTER — Emergency Department (HOSPITAL_COMMUNITY)
Admission: EM | Admit: 2015-06-11 | Discharge: 2015-06-11 | Disposition: A | Discharge status: Home / Self Care | Payer: Medicare Other | Attending: Emergency Medicine | Admitting: Emergency Medicine

## 2015-06-11 DIAGNOSIS — I10 Essential (primary) hypertension: Secondary | ICD-10-CM | POA: Insufficient documentation

## 2015-06-11 DIAGNOSIS — F419 Anxiety disorder, unspecified: Secondary | ICD-10-CM | POA: Diagnosis not present

## 2015-06-11 DIAGNOSIS — E119 Type 2 diabetes mellitus without complications: Secondary | ICD-10-CM | POA: Diagnosis not present

## 2015-06-11 DIAGNOSIS — M79604 Pain in right leg: Secondary | ICD-10-CM | POA: Insufficient documentation

## 2015-06-11 DIAGNOSIS — G8929 Other chronic pain: Secondary | ICD-10-CM | POA: Diagnosis not present

## 2015-06-11 DIAGNOSIS — Z720 Tobacco use: Secondary | ICD-10-CM | POA: Insufficient documentation

## 2015-06-11 DIAGNOSIS — M79605 Pain in left leg: Secondary | ICD-10-CM | POA: Insufficient documentation

## 2015-06-11 DIAGNOSIS — Z79899 Other long term (current) drug therapy: Secondary | ICD-10-CM | POA: Insufficient documentation

## 2015-06-11 DIAGNOSIS — M545 Low back pain: Secondary | ICD-10-CM | POA: Insufficient documentation

## 2015-06-11 DIAGNOSIS — M549 Dorsalgia, unspecified: Secondary | ICD-10-CM

## 2015-06-11 DIAGNOSIS — Z794 Long term (current) use of insulin: Secondary | ICD-10-CM | POA: Insufficient documentation

## 2015-06-11 DIAGNOSIS — Z791 Long term (current) use of non-steroidal anti-inflammatories (NSAID): Secondary | ICD-10-CM | POA: Insufficient documentation

## 2015-06-11 MED ORDER — ONDANSETRON HCL 4 MG PO TABS
4.0000 mg | ORAL_TABLET | Freq: Once | ORAL | Status: AC
  Administered 2015-06-11: 4 mg via ORAL
  Filled 2015-06-11: qty 1

## 2015-06-11 MED ORDER — DIAZEPAM 5 MG PO TABS
5.0000 mg | ORAL_TABLET | Freq: Once | ORAL | Status: AC
  Administered 2015-06-11: 5 mg via ORAL
  Filled 2015-06-11: qty 1

## 2015-06-11 MED ORDER — KETOROLAC TROMETHAMINE 10 MG PO TABS
10.0000 mg | ORAL_TABLET | Freq: Once | ORAL | Status: AC
  Administered 2015-06-11: 10 mg via ORAL
  Filled 2015-06-11: qty 1

## 2015-06-11 MED ORDER — MORPHINE SULFATE (PF) 4 MG/ML IV SOLN
8.0000 mg | Freq: Once | INTRAVENOUS | Status: AC
  Administered 2015-06-11: 8 mg via INTRAMUSCULAR
  Filled 2015-06-11: qty 2

## 2015-06-11 NOTE — ED Notes (Signed)
PT c/o lower back pain radiating down both legs and states she was told to come to ED from her pain management doctor. PT denies any new injuries.

## 2015-06-11 NOTE — ED Provider Notes (Signed)
CSN: [id number]     Arrival date & time 06/11/15  1521 History  This chart was scribed for non-physician practitioner Ivery Quale, PA-C working with Mancel Bale, MD by Lyndel Safe, ED Scribe. This patient was seen in room APFT23/APFT23 and the patient's care was started at 4:26 PM.    Chief Complaint  Patient presents with  . Back Pain    The history is provided by the patient. No language interpreter was used.   HPI Comments: Jaclyn Cruz is a 44 y.o. female, with a PMhx of chronic pain in BLE and back, HTN, HLD, and DM, who presents to the Emergency Department complaining of worsening, severe, constant pain that began in her bilateral feet and has since radiated posteriorly to her lower back and bilateral hips X 3 days. She describes her pain to be throbbing at rest but a cramping shoot pain with ambulation; pain exacerbated with ambulation. The pt is followed by pain management who she reports advised her to come to the ED today for her symptoms as their Internet was down. She associates emesis secondary to her pain. Pt is currently prescribed Butrans patches by pain management which she states have provided no relief. She is followed by pain management for chronic back pain. Denies trauma, fall or injury attributable to pain.   Past Medical History  Diagnosis Date  . Disc disorder of cervical region   . Hypertension   . High cholesterol   . Mood disorder   . Diabetes mellitus   . Diabetes mellitus, type II   . Bipolar disorder   . Anxiety    Past Surgical History  Procedure Laterality Date  . Cholecystectomy    . Tubal ligation     Family History  Problem Relation Age of Onset  . Diabetes Father   . Heart failure Father   . Stroke Father   . Alcohol abuse Father   . Depression Mother   . Depression Brother   . Depression Son   . Depression Son    Social History  Substance Use Topics  . Smoking status: Current Every Day Smoker -- 0.50 packs/day for 25 years    Types: Cigarettes  . Smokeless tobacco: Never Used  . Alcohol Use: No   OB History    Gravida Para Term Preterm AB TAB SAB Ectopic Multiple Living   Review of Systems  Musculoskeletal: Positive for back pain ( lumbar) and arthralgias ( BLE posteriorly ).  All other systems reviewed and are negative.  Allergies  Wheat; Soybean-containing drug products; Tape; Codeine; and Compazine  Home Medications   Prior to Admission medications   Medication Sig Start Date End Date Taking? Authorizing Provider  ALPRAZolam Prudy Feeler) 1 MG tablet Take 1 tablet (1 mg total) by mouth 3 (three) times daily as needed for anxiety. 04/10/15   Tammy Triplett, PA-C  butalbital-acetaminophen-caffeine (FIORICET, ESGIC) 50-325-40 MG per tablet Take 1-2 tablets by mouth every 6 (six) hours as needed for headache or migraine.  03/22/15   Historical Provider, MD  BUTRANS 10 MCG/HR PTWK patch Place 10 mcg onto the skin every 7 (seven) days. 03/27/15   Historical Provider, MD  diclofenac (VOLTAREN) 75 MG EC tablet Take 1 tablet (75 mg total) by mouth 2 (two) times daily. Take with food 04/10/15   Tammy Triplett, PA-C  insulin glargine (LANTUS) 100 UNIT/ML injection Inject 60 Units into the skin at bedtime.  Historical Provider, MD  insulin lispro (HUMALOG) 100 UNIT/ML injection Inject 16-22 Units into the skin 3 (three) times daily before meals. Per sliding scale. Pt will not exceed 15 units.    Historical Provider, MD  lamoTRIgine (LAMICTAL) 100 MG tablet Take 1 tablet (100 mg total) by mouth at bedtime. Patient not taking: Reported on 04/10/2015 09/26/14 09/26/15  Myrlene Broker, MD  Lurasidone HCl (LATUDA) 60 MG TABS Take 60 mg by mouth daily. Patient not taking: Reported on 04/10/2015 09/26/14   Myrlene Broker, MD  LYRICA 200 MG capsule Take 200 mg by mouth 2 (two) times daily. 03/22/15   Historical Provider, MD  oxyCODONE-acetaminophen (PERCOCET/ROXICET) 5-325 MG per tablet Take 1 tablet by mouth every 4  (four) hours as needed. Patient not taking: Reported on 04/10/2015 08/16/14   Tammy Triplett, PA-C  tiZANidine (ZANAFLEX) 2 MG tablet Take 2 mg by mouth 3 (three) times daily. 06/09/14   Historical Provider, MD  topiramate (TOPAMAX) 50 MG tablet Take 50 mg by mouth 2 (two) times daily. 03/22/15   Historical Provider, MD  venlafaxine XR (EFFEXOR XR) 150 MG 24 hr capsule Take 1 capsule (150 mg total) by mouth 2 (two) times daily. Patient not taking: Reported on 04/10/2015 09/26/14   Myrlene Broker, MD  zolpidem (AMBIEN) 10 MG tablet Take 1 tablet (10 mg total) by mouth at bedtime as needed for sleep. Patient not taking: Reported on 04/10/2015 09/26/14 10/26/14  Myrlene Broker, MD   BP 141/73 mmHg  Pulse 78  Temp(Src) 97.6 F (36.4 C) (Oral)  Resp 20  Ht  (1.626 m)  Wt 170 lb (77.111 kg)  BMI 29.17 kg/m2  SpO2 97%  LMP 06/06/2015 Physical Exam  Constitutional: She is oriented to person, place, and time. She appears well-developed and well-nourished. No distress.  HENT:  Head: Normocephalic.  Eyes: Conjunctivae are normal.  Neck: Normal range of motion. Neck supple.  Cardiovascular: Normal rate, regular rhythm and normal heart sounds.   Pulmonary/Chest: Effort normal and breath sounds normal. No respiratory distress.  Abdominal: Soft. Bowel sounds are normal. There is no tenderness.  Musculoskeletal: Normal range of motion. She exhibits tenderness. She exhibits no edema.  Cap refill less than 2 seconds bilaterally; DP pulse 2 + bilaterally; no temperature changes of BLE; pain to bilateral calves but no swelling and no redness; no edema; pain with movement of BLE.  Neurological: She is alert and oriented to person, place, and time. Coordination normal.  Coordination intact; no motor deficits appreciated.  Skin: Skin is warm.  Psychiatric: She has a normal mood and affect. Her behavior is normal.  Nursing note and vitals reviewed.   ED Course Case discussed with Dr Ronal Fear office. He  suggest an injection for now. They will try to see the patient tomorrow or this week.  Procedures  DIAGNOSTIC STUDIES: Oxygen Saturation is 97% on RA, normal by my interpretation.    COORDINATION OF CARE: 4:34 PM Discussed treatment plan with pt at bedside and pt agreed to plan.   Labs Review Labs Reviewed - No data to display  Imaging Review No results found. I have personally reviewed and evaluated these images and lab results as part of my medical decision-making.   EKG Interpretation None      MDM  Patient has a history of chronic back pain, and chronic leg pain. She is currently in pain management. No gross neurologic deficits appreciated on the examination today. The vital signs are within normal limits. Case  was discussed with the pain management specialist. The patient will be treated with intramuscular morphine. Oral Toradol and Zofran will also be used to treat the patient. Patient is to follow-up with the pain management specialist on tomorrow or this week.    Final diagnoses:  None    **I have reviewed nursing notes, vital signs, and all appropriate lab and imaging results for this patient.*  **I personally performed the services described in this documentation, which was scribed in my presence. The recorded information has been reviewed and is accurate.Ivery Quale, PA-C 06/11/15 1649  Mancel Bale, MD 06/12/15 [phone]

## 2015-06-11 NOTE — Discharge Instructions (Signed)
Please see Dr Gerilyn Pilgrim in the office as soon as possible. Please rest your legs and back is much as possible. Heating pad may be helpful. You were treated with narcotic pain medication, and muscle relaxant medication. Please use caution getting around this evening. Back Pain, Adult Back pain is very common. The pain often gets better over time. The cause of back pain is usually not dangerous. Most people can learn to manage their back pain on their own.  HOME CARE   Stay active. Start with short walks on flat ground if you can. Try to walk farther each day.  Do not sit, drive, or stand in one place for more than 30 minutes. Do not stay in bed.  Do not avoid exercise or work. Activity can help your back heal faster.  Be careful when you bend or lift an object. Bend at your knees, keep the object close to you, and do not twist.  Sleep on a firm mattress. Lie on your side, and bend your knees. If you lie on your back, put a pillow under your knees.  Only take medicines as told by your doctor.  Put ice on the injured area.  Put ice in a plastic bag.  Place a towel between your skin and the bag.  Leave the ice on for 15-20 minutes, 03-04 times a day for the first 2 to 3 days. After that, you can switch between ice and heat packs.  Ask your doctor about back exercises or massage.  Avoid feeling anxious or stressed. Find good ways to deal with stress, such as exercise. GET HELP RIGHT AWAY IF:   Your pain does not go away with rest or medicine.  Your pain does not go away in 1 week.  You have new problems.  You do not feel well.  The pain spreads into your legs.  You cannot control when you poop (bowel movement) or pee (urinate).  Your arms or legs feel weak or lose feeling (numbness).  You feel sick to your stomach (nauseous) or throw up (vomit).  You have belly (abdominal) pain.  You feel like you may pass out (faint). MAKE SURE YOU:   Understand these  instructions.  Will watch your condition.  Will get help right away if you are not doing well or get worse. Document Released: 02/18/2008 Document Revised: 11/24/2011 Document Reviewed: 01/03/2014 [facility] Patient Information 2015 [place], [place]. This information is not intended to replace advice given to you by your health care provider. Make sure you discuss any questions you have with your health care provider.

## 2015-08-28 ENCOUNTER — Encounter: Payer: Self-pay | Admitting: *Deleted

## 2015-08-28 ENCOUNTER — Ambulatory Visit: Payer: Medicare Other | Admitting: Orthopedic Surgery

## 2015-09-18 ENCOUNTER — Encounter: Payer: Self-pay | Admitting: *Deleted

## 2015-09-18 ENCOUNTER — Ambulatory Visit: Payer: Medicare Other | Admitting: Orthopedic Surgery

## 2015-10-30 ENCOUNTER — Emergency Department (HOSPITAL_COMMUNITY)
Admission: EM | Admit: 2015-10-30 | Discharge: 2015-10-30 | Disposition: A | Discharge status: Home / Self Care | Payer: Medicare Other | Attending: Emergency Medicine | Admitting: Emergency Medicine

## 2015-10-30 ENCOUNTER — Encounter (HOSPITAL_COMMUNITY): Payer: Self-pay | Admitting: Emergency Medicine

## 2015-10-30 DIAGNOSIS — I10 Essential (primary) hypertension: Secondary | ICD-10-CM | POA: Diagnosis not present

## 2015-10-30 DIAGNOSIS — F319 Bipolar disorder, unspecified: Secondary | ICD-10-CM | POA: Diagnosis not present

## 2015-10-30 DIAGNOSIS — Z794 Long term (current) use of insulin: Secondary | ICD-10-CM | POA: Insufficient documentation

## 2015-10-30 DIAGNOSIS — Z79899 Other long term (current) drug therapy: Secondary | ICD-10-CM | POA: Diagnosis not present

## 2015-10-30 DIAGNOSIS — M545 Low back pain: Secondary | ICD-10-CM | POA: Diagnosis not present

## 2015-10-30 DIAGNOSIS — F419 Anxiety disorder, unspecified: Secondary | ICD-10-CM | POA: Diagnosis not present

## 2015-10-30 DIAGNOSIS — F17210 Nicotine dependence, cigarettes, uncomplicated: Secondary | ICD-10-CM | POA: Insufficient documentation

## 2015-10-30 DIAGNOSIS — G8929 Other chronic pain: Secondary | ICD-10-CM | POA: Insufficient documentation

## 2015-10-30 DIAGNOSIS — E119 Type 2 diabetes mellitus without complications: Secondary | ICD-10-CM | POA: Insufficient documentation

## 2015-10-30 DIAGNOSIS — Z791 Long term (current) use of non-steroidal anti-inflammatories (NSAID): Secondary | ICD-10-CM | POA: Insufficient documentation

## 2015-10-30 DIAGNOSIS — M549 Dorsalgia, unspecified: Secondary | ICD-10-CM

## 2015-10-30 HISTORY — DX: Dorsalgia, unspecified: M54.9

## 2015-10-30 MED ORDER — HYDROMORPHONE HCL 1 MG/ML IJ SOLN
1.0000 mg | Freq: Once | INTRAMUSCULAR | Status: AC
Start: 2015-10-30 — End: 2015-10-30
  Administered 2015-10-30: 1 mg via INTRAMUSCULAR
  Filled 2015-10-30: qty 1

## 2015-10-30 MED ORDER — LIDOCAINE 5 % EX PTCH: 1.0000 | MEDICATED_PATCH | CUTANEOUS | Status: AC

## 2015-10-30 NOTE — ED Notes (Signed)
PT states she is a pt of Dr. Gerilyn Pilgrim for back pain and went to pharmacy for Butran (pain patch) prescription and has new insurance and it is not covered. PT denies having pain patch x2 weeks and c/o lower back pain and bilateral foot pain. PT states she has follow up appointment on 11/08/15.

## 2015-10-30 NOTE — Discharge Instructions (Signed)

## 2015-10-30 NOTE — ED Notes (Signed)
Pt alert & oriented x4, stable gait. Patient given discharge instructions, paperwork & prescription(s). Patient  instructed to stop at the registration desk to finish any additional paperwork. Patient verbalized understanding. Pt left department w/ no further questions. 

## 2015-11-02 NOTE — ED Provider Notes (Signed)
CSN: [id number]     Arrival date & time 10/30/15  1714 History   First MD Initiated Contact with Patient 10/30/15 1754     Chief Complaint  Patient presents with  . Back Pain     (Consider location/radiation/quality/duration/timing/severity/associated sxs/prior Treatment) The history is provided by the patient.   Jaclyn Cruz is a 45 y.o. female  preseniting with acute on chronic low back pain which has which has been present for years and is under the care of pain management.  Her prescription for Butran narcotic patch was recently newly denies by her insurance, and is having increased pain without this medication.   Patient denies any new injury specifically.  She has followup with Dr. Gerilyn Pilgrim on 2/23, in the interim, is requesting help with pain management. There is no radiation of pain into her lower extremity.  There has been no weakness or numbness in the lower extremities and no urinary or bowel retention or incontinence.  Patient does not have a history of cancer or IVDU.     Past Medical History  Diagnosis Date  . Disc disorder of cervical region   . Hypertension   . High cholesterol   . Mood disorder (HCC)   . Diabetes mellitus   . Diabetes mellitus, type II (HCC)   . Bipolar disorder (HCC)   . Anxiety   . Back pain    Past Surgical History  Procedure Laterality Date  . Cholecystectomy    . Tubal ligation     Family History  Problem Relation Age of Onset  . Diabetes Father   . Heart failure Father   . Stroke Father   . Alcohol abuse Father   . Depression Mother   . Depression Brother   . Depression Son   . Depression Son    Social History  Substance Use Topics  . Smoking status: Current Every Day Smoker -- 0.50 packs/day for 25 years    Types: Cigarettes  . Smokeless tobacco: Never Used  . Alcohol Use: No   OB History    Gravida Para Term Preterm AB TAB SAB Ectopic Multiple Living   4 4  4      4      Review of Systems  Constitutional: Negative  for fever.  Respiratory: Negative for shortness of breath.   Cardiovascular: Negative for chest pain and leg swelling.  Gastrointestinal: Negative for abdominal pain, constipation and abdominal distention.  Genitourinary: Negative for dysuria, urgency, frequency, flank pain and difficulty urinating.  Musculoskeletal: Positive for back pain. Negative for joint swelling and gait problem.  Skin: Negative for rash.  Neurological: Negative for weakness and numbness.      Allergies  Wheat; Soybean-containing drug products; Tape; Codeine; and Compazine  Home Medications   Prior to Admission medications   Medication Sig Start Date End Date Taking? Authorizing Provider  ALPRAZolam Prudy Feeler) 1 MG tablet Take 1 tablet (1 mg total) by mouth 3 (three) times daily as needed for anxiety. 04/10/15   Tammy Triplett, PA-C  butalbital-acetaminophen-caffeine (FIORICET, ESGIC) 50-325-40 MG per tablet Take 1-2 tablets by mouth every 6 (six) hours as needed for headache or migraine.  03/22/15   Historical Provider, MD  BUTRANS 10 MCG/HR PTWK patch Place 10 mcg onto the skin every 7 (seven) days. 03/27/15   Historical Provider, MD  diclofenac (VOLTAREN) 75 MG EC tablet Take 1 tablet (75 mg total) by mouth 2 (two) times daily. Take with food 04/10/15   Tammy Triplett, PA-C  insulin glargine (  LANTUS) 100 UNIT/ML injection Inject 60 Units into the skin at bedtime.     Historical Provider, MD  insulin lispro (HUMALOG) 100 UNIT/ML injection Inject 16-22 Units into the skin 3 (three) times daily before meals. Per sliding scale. Pt will not exceed 15 units.    Historical Provider, MD  lidocaine (LIDODERM) 5 % Place 1 patch onto the skin daily. Remove & Discard patch within 12 hours or as directed by MD 10/30/15   Burgess Amor, PA-C  Lurasidone HCl (LATUDA) 60 MG TABS Take 60 mg by mouth daily. Patient not taking: Reported on 04/10/2015 09/26/14   Myrlene Broker, MD  LYRICA 200 MG capsule Take 200 mg by mouth 2 (two) times daily.  03/22/15   Historical Provider, MD  oxyCODONE-acetaminophen (PERCOCET/ROXICET) 5-325 MG per tablet Take 1 tablet by mouth every 4 (four) hours as needed. Patient not taking: Reported on 04/10/2015 08/16/14   Tammy Triplett, PA-C  tiZANidine (ZANAFLEX) 2 MG tablet Take 2 mg by mouth 3 (three) times daily. 06/09/14   Historical Provider, MD  topiramate (TOPAMAX) 50 MG tablet Take 50 mg by mouth 2 (two) times daily. 03/22/15   Historical Provider, MD  venlafaxine XR (EFFEXOR XR) 150 MG 24 hr capsule Take 1 capsule (150 mg total) by mouth 2 (two) times daily. Patient not taking: Reported on 04/10/2015 09/26/14   Myrlene Broker, MD  zolpidem (AMBIEN) 10 MG tablet Take 1 tablet (10 mg total) by mouth at bedtime as needed for sleep. Patient not taking: Reported on 04/10/2015 09/26/14 10/26/14  Myrlene Broker, MD   BP 123/95 mmHg  Pulse 100  Temp(Src) 98.2 F (36.8 C) (Oral)  Resp 16  Ht  (1.626 m)  Wt 68.947 kg  BMI 26.08 kg/m2  SpO2 98%  LMP 10/17/2015 Physical Exam  Constitutional: She appears well-developed and well-nourished.  HENT:  Head: Normocephalic.  Eyes: Conjunctivae are normal.  Neck: Normal range of motion. Neck supple.  Cardiovascular: Normal rate and intact distal pulses.   Pedal pulses normal.  Pulmonary/Chest: Effort normal.  Abdominal: Soft. Bowel sounds are normal. She exhibits no distension and no mass.  Musculoskeletal: Normal range of motion. She exhibits no edema.       Lumbar back: She exhibits tenderness. She exhibits no swelling, no edema and no spasm.  Neurological: She is alert. She has normal strength. She displays no atrophy and no tremor. No sensory deficit. Gait normal.  Reflex Scores:      Patellar reflexes are 2+ on the right side and 2+ on the left side.      Achilles reflexes are 2+ on the right side and 2+ on the left side. No strength deficit noted in hip and knee flexor and extensor muscle groups.  Ankle flexion and extension intact.  Skin: Skin is warm  and dry.  Psychiatric: She has a normal mood and affect.  Nursing note and vitals reviewed.   ED Course  Procedures (including critical care time) Labs Review Labs Reviewed - No data to display  Imaging Review No results found. I have personally reviewed and evaluated these images and lab results as part of my medical decision-making.   EKG Interpretation None      MDM   Final diagnoses:  Chronic back pain   Pt given dilaudid IM injection with some improvement in pain.  Discussed ED narcotic policy which pt understands.  She was given a prescription for lidoderm patches which may help bridge until can be re-evaluated by Dr.  [place].  Also advised contacting him sooner for any worsened sx. No neuro deficit on exam or by history to suggest emergent or surgical presentation. Discussed worsened sx that should prompt immediate re-evaluation including distal weakness, bowel/bladder retention/incontinence.          Burgess Amor, PA-C 11/02/15 1610  Bethann Berkshire, MD 11/03/15 [phone]

## 2015-11-05 ENCOUNTER — Emergency Department (HOSPITAL_COMMUNITY)
Admission: EM | Admit: 2015-11-05 | Discharge: 2015-11-05 | Disposition: A | Discharge status: Home / Self Care | Payer: Medicare Other | Attending: Emergency Medicine | Admitting: Emergency Medicine

## 2015-11-05 ENCOUNTER — Encounter (HOSPITAL_COMMUNITY): Payer: Self-pay | Admitting: *Deleted

## 2015-11-05 DIAGNOSIS — Z791 Long term (current) use of non-steroidal anti-inflammatories (NSAID): Secondary | ICD-10-CM | POA: Insufficient documentation

## 2015-11-05 DIAGNOSIS — Z79899 Other long term (current) drug therapy: Secondary | ICD-10-CM | POA: Diagnosis not present

## 2015-11-05 DIAGNOSIS — M545 Low back pain: Secondary | ICD-10-CM | POA: Diagnosis not present

## 2015-11-05 DIAGNOSIS — G8929 Other chronic pain: Secondary | ICD-10-CM | POA: Diagnosis not present

## 2015-11-05 DIAGNOSIS — F419 Anxiety disorder, unspecified: Secondary | ICD-10-CM | POA: Diagnosis not present

## 2015-11-05 DIAGNOSIS — E119 Type 2 diabetes mellitus without complications: Secondary | ICD-10-CM | POA: Insufficient documentation

## 2015-11-05 DIAGNOSIS — F17210 Nicotine dependence, cigarettes, uncomplicated: Secondary | ICD-10-CM | POA: Diagnosis not present

## 2015-11-05 DIAGNOSIS — F319 Bipolar disorder, unspecified: Secondary | ICD-10-CM | POA: Insufficient documentation

## 2015-11-05 DIAGNOSIS — I10 Essential (primary) hypertension: Secondary | ICD-10-CM | POA: Insufficient documentation

## 2015-11-05 DIAGNOSIS — M549 Dorsalgia, unspecified: Secondary | ICD-10-CM

## 2015-11-05 DIAGNOSIS — Z794 Long term (current) use of insulin: Secondary | ICD-10-CM | POA: Diagnosis not present

## 2015-11-05 LAB — CBG MONITORING, ED: Glucose-Capillary: 153 mg/dL — ABNORMAL HIGH (ref 65–99)

## 2015-11-05 MED ORDER — HYDROMORPHONE HCL 1 MG/ML IJ SOLN
2.0000 mg | Freq: Once | INTRAMUSCULAR | Status: AC
  Administered 2015-11-05: 2 mg via INTRAMUSCULAR
  Filled 2015-11-05: qty 2

## 2015-11-05 MED ORDER — CARISOPRODOL 350 MG PO TABS: 350.0000 mg | ORAL_TABLET | Freq: Three times a day (TID) | ORAL | Status: DC

## 2015-11-05 MED ORDER — HYDROMORPHONE HCL 1 MG/ML IJ SOLN
INTRAMUSCULAR | Status: AC
  Filled 2015-11-05: qty 1

## 2015-11-05 MED ORDER — ONDANSETRON 8 MG PO TBDP
8.0000 mg | ORAL_TABLET | Freq: Once | ORAL | Status: AC
  Administered 2015-11-05: 8 mg via ORAL
  Filled 2015-11-05: qty 1

## 2015-11-05 NOTE — Discharge Instructions (Signed)

## 2015-11-05 NOTE — ED Notes (Signed)
Chronic lower back pain. States she was on pain patch but insurance stopped covering it. States she was the prescribed percocet by Dr. Gerilyn Pilgrim but did not take this morning because she states it is not working and "I am taking muscle relaxers like candy"

## 2015-11-07 NOTE — ED Provider Notes (Signed)
CSN: [id number]      History   First MD Initiated Contact with Patient 10/30/15 1754     Chief Complaint  Patient presents with  . Back Pain     (Consider location/radiation/quality/duration/timing/severity/associated sxs/prior Treatment) Back Pain Associated symptoms: no abdominal pain, no chest pain, no dysuria, no fever, no numbness and no weakness   Patient is a 45 y.o. female presenting with back pain. The history is provided by the patient.   Jaclyn Cruz is a 45 y.o. female  preseniting with acute on chronic low back pain which has which has been present for years and is under the care of pain management.  Her prescription for Butran narcotic patch was recently newly denies by her insurance, and is having increased pain without this medication.   Patient denies any new injury specifically.  She has followup with Dr. Gerilyn Pilgrim on 2/23, in the interim, is requesting help with pain management.  There is no radiation of pain into her lower extremity.  There has been no weakness or numbness in the lower extremities and no urinary or bowel retention or incontinence.  Patient does not have a history of cancer or IVDU.     Past Medical History  Diagnosis Date  . Disc disorder of cervical region   . Hypertension   . High cholesterol   . Mood disorder (HCC)   . Diabetes mellitus   . Diabetes mellitus, type II (HCC)   . Bipolar disorder (HCC)   . Anxiety   . Back pain    Past Surgical History  Procedure Laterality Date  . Cholecystectomy    . Tubal ligation     Family History  Problem Relation Age of Onset  . Diabetes Father   . Heart failure Father   . Stroke Father   . Alcohol abuse Father   . Depression Mother   . Depression Brother   . Depression Son   . Depression Son    Social History  Substance Use Topics  . Smoking status: Current Every Day Smoker -- 0.50 packs/day for 25 years    Types: Cigarettes  . Smokeless tobacco: Never Used  . Alcohol Use: No   OB  History    Gravida Para Term Preterm AB TAB SAB Ectopic Multiple Living   Review of Systems  Constitutional: Negative for fever.  Respiratory: Negative for shortness of breath.   Cardiovascular: Negative for chest pain and leg swelling.  Gastrointestinal: Negative for abdominal pain, constipation and abdominal distention.  Genitourinary: Negative for dysuria, urgency, frequency, flank pain and difficulty urinating.  Musculoskeletal: Positive for back pain. Negative for joint swelling and gait problem.  Skin: Negative for rash.  Neurological: Negative for weakness and numbness.      Allergies  Wheat; Soybean-containing drug products; Tape; Codeine; and Compazine  Home Medications   Prior to Admission medications   Medication Sig Start Date End Date Taking? Authorizing Provider  ALPRAZolam Prudy Feeler) 1 MG tablet Take 1 tablet (1 mg total) by mouth 3 (three) times daily as needed for anxiety. 04/10/15   Tammy Triplett, PA-C  butalbital-acetaminophen-caffeine (FIORICET, ESGIC) 50-325-40 MG per tablet Take 1-2 tablets by mouth every 6 (six) hours as needed for headache or migraine.  03/22/15   Historical Provider, MD  BUTRANS 10 MCG/HR PTWK patch Place 10 mcg onto the skin every 7 (seven) days. 03/27/15   Historical Provider, MD  carisoprodol (SOMA) 350 MG  tablet Take 1 tablet (350 mg total) by mouth 3 (three) times daily. 11/05/15   Burgess Amor, PA-C  diclofenac (VOLTAREN) 75 MG EC tablet Take 1 tablet (75 mg total) by mouth 2 (two) times daily. Take with food 04/10/15   Tammy Triplett, PA-C  insulin glargine (LANTUS) 100 UNIT/ML injection Inject 60 Units into the skin at bedtime.     Historical Provider, MD  insulin lispro (HUMALOG) 100 UNIT/ML injection Inject 16-22 Units into the skin 3 (three) times daily before meals. Per sliding scale. Pt will not exceed 15 units.    Historical Provider, MD  lidocaine (LIDODERM) 5 % Place 1 patch onto the skin daily. Remove & Discard patch  within 12 hours or as directed by MD 10/30/15   Burgess Amor, PA-C  Lurasidone HCl (LATUDA) 60 MG TABS Take 60 mg by mouth daily. Patient not taking: Reported on 04/10/2015 09/26/14   Myrlene Broker, MD  LYRICA 200 MG capsule Take 200 mg by mouth 2 (two) times daily. 03/22/15   Historical Provider, MD  oxyCODONE-acetaminophen (PERCOCET/ROXICET) 5-325 MG per tablet Take 1 tablet by mouth every 4 (four) hours as needed. Patient not taking: Reported on 04/10/2015 08/16/14   Tammy Triplett, PA-C  topiramate (TOPAMAX) 50 MG tablet Take 50 mg by mouth 2 (two) times daily. 03/22/15   Historical Provider, MD  venlafaxine XR (EFFEXOR XR) 150 MG 24 hr capsule Take 1 capsule (150 mg total) by mouth 2 (two) times daily. Patient not taking: Reported on 04/10/2015 09/26/14   Myrlene Broker, MD  zolpidem (AMBIEN) 10 MG tablet Take 1 tablet (10 mg total) by mouth at bedtime as needed for sleep. Patient not taking: Reported on 04/10/2015 09/26/14 10/26/14  Myrlene Broker, MD   BP 131/80 mmHg  Pulse 106  Temp(Src) 97.7 F (36.5 C) (Oral)  Resp 18  Ht  (1.626 m)  Wt 72.122 kg  BMI 27.28 kg/m2  SpO2 100%  LMP 10/17/2015 Physical Exam  Constitutional: She appears well-developed and well-nourished.  HENT:  Head: Normocephalic.  Eyes: Conjunctivae are normal.  Neck: Normal range of motion. Neck supple.  Cardiovascular: Normal rate and intact distal pulses.   Pedal pulses normal.  Pulmonary/Chest: Effort normal.  Abdominal: Soft. Bowel sounds are normal. She exhibits no distension and no mass.  Musculoskeletal: Normal range of motion. She exhibits no edema.       Lumbar back: She exhibits tenderness. She exhibits no swelling, no edema and no spasm.  Neurological: She is alert. She has normal strength. She displays no atrophy and no tremor. No sensory deficit. Gait normal.  Reflex Scores:      Patellar reflexes are 2+ on the right side and 2+ on the left side.      Achilles reflexes are 2+ on the right side and 2+  on the left side. No strength deficit noted in hip and knee flexor and extensor muscle groups.  Ankle flexion and extension intact.  Skin: Skin is warm and dry.  Psychiatric: She has a normal mood and affect.  Nursing note and vitals reviewed.   ED Course  Procedures  (including critical care time) Labs Review Labs Reviewed  CBG MONITORING, ED - Abnormal; Notable for the following:    Glucose-Capillary 153 (*)    All other components within normal limits    Imaging Review No results found. I have personally reviewed and evaluated these images and lab results as part of my medical decision-making.   EKG Interpretation None  MDM   Final diagnoses:  Chronic back pain   She was seen here on 2/20 with similar complaints at which time is was explained to here we cannot prescribe for her chronic pain. She was prescribed lidoderm patches which she reports did not alleviate her pain.  Pt given dilaudid IM injection with some improvement in pain.  No neuro deficit on exam or by history to suggest emergent or surgical presentation. Discussed worsened sx that should prompt immediate re-evaluation including distal weakness, bowel/bladder retention/incontinence. Advised f/u with Dr. Gerilyn Pilgrim as planned.          Burgess Amor, PA-C 11/02/15 1610  Bethann Berkshire, MD 11/03/15 1647  Burgess Amor, PA-C 11/07/15 9604  Glynn Octave, MD 11/07/15 [phone]

## 2015-11-09 ENCOUNTER — Encounter (HOSPITAL_COMMUNITY): Payer: Self-pay | Admitting: Emergency Medicine

## 2015-11-09 ENCOUNTER — Emergency Department (HOSPITAL_COMMUNITY)
Admission: EM | Admit: 2015-11-09 | Discharge: 2015-11-10 | Disposition: A | Discharge status: Home / Self Care | Payer: Medicare Other | Attending: Emergency Medicine | Admitting: Emergency Medicine

## 2015-11-09 DIAGNOSIS — Z791 Long term (current) use of non-steroidal anti-inflammatories (NSAID): Secondary | ICD-10-CM | POA: Insufficient documentation

## 2015-11-09 DIAGNOSIS — M545 Low back pain: Secondary | ICD-10-CM | POA: Insufficient documentation

## 2015-11-09 DIAGNOSIS — Z79899 Other long term (current) drug therapy: Secondary | ICD-10-CM | POA: Diagnosis not present

## 2015-11-09 DIAGNOSIS — F17210 Nicotine dependence, cigarettes, uncomplicated: Secondary | ICD-10-CM | POA: Diagnosis not present

## 2015-11-09 DIAGNOSIS — I10 Essential (primary) hypertension: Secondary | ICD-10-CM | POA: Diagnosis not present

## 2015-11-09 DIAGNOSIS — Z794 Long term (current) use of insulin: Secondary | ICD-10-CM | POA: Diagnosis not present

## 2015-11-09 DIAGNOSIS — F419 Anxiety disorder, unspecified: Secondary | ICD-10-CM | POA: Insufficient documentation

## 2015-11-09 DIAGNOSIS — F319 Bipolar disorder, unspecified: Secondary | ICD-10-CM | POA: Insufficient documentation

## 2015-11-09 DIAGNOSIS — G8929 Other chronic pain: Secondary | ICD-10-CM

## 2015-11-09 DIAGNOSIS — E119 Type 2 diabetes mellitus without complications: Secondary | ICD-10-CM | POA: Diagnosis not present

## 2015-11-09 DIAGNOSIS — M549 Dorsalgia, unspecified: Secondary | ICD-10-CM

## 2015-11-09 HISTORY — DX: Pain, unspecified: R52

## 2015-11-09 NOTE — ED Notes (Addendum)
Patient complaining of chronic lower back pain "for years." States "I had a pain patch and I went to refill my prescription but the insurance won't cover it. States she went today to check on prescription and was told it may be 5 days before she can get her medication filled. Patient was seen here this week for same.

## 2015-11-10 DIAGNOSIS — M545 Low back pain: Secondary | ICD-10-CM | POA: Diagnosis not present

## 2015-11-10 MED ORDER — PREDNISONE 10 MG PO TABS: ORAL_TABLET | ORAL | Status: DC

## 2015-11-10 MED ORDER — PREDNISONE 50 MG PO TABS
50.0000 mg | ORAL_TABLET | Freq: Once | ORAL | Status: AC
  Administered 2015-11-10: 50 mg via ORAL
  Filled 2015-11-10: qty 1

## 2015-11-10 MED ORDER — HYDROMORPHONE HCL 1 MG/ML IJ SOLN
1.0000 mg | Freq: Once | INTRAMUSCULAR | Status: AC
  Administered 2015-11-10: 1 mg via INTRAMUSCULAR
  Filled 2015-11-10: qty 1

## 2015-11-10 NOTE — Discharge Instructions (Signed)

## 2015-11-10 NOTE — ED Provider Notes (Signed)
History     Chief Complaint  Patient presents with  . Back Pain     (Consider location/radiation/quality/duration/timing/severity/associated sxs/prior Treatment) Patient is a 45 y.o. female presenting with back pain. The history is provided by the patient.  Back Pain Associated symptoms: no abdominal pain, no chest pain, no dysuria, no fever, no numbness and no weakness    Jaclyn Cruz is a 45 y.o. female  preseniting with acute on chronic low back pain which has which has been present for years and is under the care of pain management.  Her prescription for Butran narcotic patch is no longer being covered by her insurance, and is having increased pain without this medication.   Patient denies any new injury specifically.  She has followed with Dr. Gerilyn Pilgrim yesterday and she was prescribed a new narcotic which she was told is just like [place] but pill form and is now once again awaiting approval by her insurance which she was told may take up to 5 days. She denies any new injuries, there is no radiation of pain into her lower extremity.  There has been no weakness or numbness in the lower extremities and no urinary or bowel retention or incontinence.  Patient does not have a history of cancer or IVDU.     Past Medical History  Diagnosis Date  . Disc disorder of cervical region   . Hypertension   . High cholesterol   . Mood disorder (HCC)   . Diabetes mellitus   . Diabetes mellitus, type II (HCC)   . Bipolar disorder (HCC)   . Anxiety   . Back pain   . Pain management    Past Surgical History  Procedure Laterality Date  . Cholecystectomy    . Tubal ligation     Family History  Problem Relation Age of Onset  . Diabetes Father   . Heart failure Father   . Stroke Father   . Alcohol abuse Father   . Depression Mother   . Depression Brother   . Depression Son   . Depression Son    Social History  Substance Use Topics  . Smoking status: Current Every Day Smoker -- 0.50  packs/day for 25 years    Types: Cigarettes  . Smokeless tobacco: Never Used  . Alcohol Use: No   OB History    Gravida Para Term Preterm AB TAB SAB Ectopic Multiple Living   Review of Systems  Constitutional: Negative for fever.  Respiratory: Negative for shortness of breath.   Cardiovascular: Negative for chest pain and leg swelling.  Gastrointestinal: Negative for abdominal pain, constipation and abdominal distention.  Genitourinary: Negative for dysuria, urgency, frequency, flank pain and difficulty urinating.  Musculoskeletal: Positive for back pain. Negative for joint swelling and gait problem.  Skin: Negative for rash.  Neurological: Negative for weakness and numbness.      Allergies  Wheat; Soybean-containing drug products; Tape; Codeine; and Compazine  Home Medications   Prior to Admission medications   Medication Sig Start Date End Date Taking? Authorizing Provider  ALPRAZolam Prudy Feeler) 1 MG tablet Take 1 tablet (1 mg total) by mouth 3 (three) times daily as needed for anxiety. 04/10/15   Tammy Triplett, PA-C  butalbital-acetaminophen-caffeine (FIORICET, ESGIC) 50-325-40 MG per tablet Take 1-2 tablets by mouth every 6 (six) hours as needed for headache or migraine.  03/22/15   Historical Provider, MD  BUTRANS 10 MCG/HR PTWK patch  Place 10 mcg onto the skin every 7 (seven) days. 03/27/15   Historical Provider, MD  carisoprodol (SOMA) 350 MG tablet Take 1 tablet (350 mg total) by mouth 3 (three) times daily. 11/05/15   Burgess Amor, PA-C  diclofenac (VOLTAREN) 75 MG EC tablet Take 1 tablet (75 mg total) by mouth 2 (two) times daily. Take with food 04/10/15   Tammy Triplett, PA-C  insulin glargine (LANTUS) 100 UNIT/ML injection Inject 60 Units into the skin at bedtime.     Historical Provider, MD  insulin lispro (HUMALOG) 100 UNIT/ML injection Inject 16-22 Units into the skin 3 (three) times daily before meals. Per sliding scale. Pt will not exceed 15 units.     Historical Provider, MD  lidocaine (LIDODERM) 5 % Place 1 patch onto the skin daily. Remove & Discard patch within 12 hours or as directed by MD 10/30/15   Burgess Amor, PA-C  Lurasidone HCl (LATUDA) 60 MG TABS Take 60 mg by mouth daily. Patient not taking: Reported on 04/10/2015 09/26/14   Myrlene Broker, MD  LYRICA 200 MG capsule Take 200 mg by mouth 2 (two) times daily. 03/22/15   Historical Provider, MD  oxyCODONE-acetaminophen (PERCOCET/ROXICET) 5-325 MG per tablet Take 1 tablet by mouth every 4 (four) hours as needed. Patient not taking: Reported on 04/10/2015 08/16/14   Tammy Triplett, PA-C  predniSONE (DELTASONE) 10 MG tablet 5, 4, 3, 2 then 1 tablet by mouth daily for 5 days total. 11/10/15   Burgess Amor, PA-C  topiramate (TOPAMAX) 50 MG tablet Take 50 mg by mouth 2 (two) times daily. 03/22/15   Historical Provider, MD  venlafaxine XR (EFFEXOR XR) 150 MG 24 hr capsule Take 1 capsule (150 mg total) by mouth 2 (two) times daily. Patient not taking: Reported on 04/10/2015 09/26/14   Myrlene Broker, MD  zolpidem (AMBIEN) 10 MG tablet Take 1 tablet (10 mg total) by mouth at bedtime as needed for sleep. Patient not taking: Reported on 04/10/2015 09/26/14 10/26/14  Myrlene Broker, MD   BP 124/76 mmHg  Pulse 111  Temp(Src) 98.2 F (36.8 C) (Oral)  Resp 16  Ht  (1.626 m)  Wt 72.122 kg  BMI 27.28 kg/m2  SpO2 100%  LMP 10/17/2015 Physical Exam  Constitutional: She appears well-developed and well-nourished.  HENT:  Head: Normocephalic.  Eyes: Conjunctivae are normal.  Neck: Normal range of motion. Neck supple.  Cardiovascular: Normal rate and intact distal pulses.   Pedal pulses normal.  Pulmonary/Chest: Effort normal.  Abdominal: Soft. Bowel sounds are normal. She exhibits no distension and no mass.  Musculoskeletal: Normal range of motion. She exhibits no edema.       Lumbar back: She exhibits tenderness. She exhibits no swelling, no edema and no spasm.  Neurological: She is alert. She has  normal strength. She displays no atrophy and no tremor. No sensory deficit. Gait normal.  Reflex Scores:      Patellar reflexes are 2+ on the right side and 2+ on the left side.      Achilles reflexes are 2+ on the right side and 2+ on the left side. No strength deficit noted in hip and knee flexor and extensor muscle groups.  Ankle flexion and extension intact.  Skin: Skin is warm and dry.  Psychiatric: She has a normal mood and affect.  Nursing note and vitals reviewed.   ED Course  Procedures  (including critical care time) Labs Review Labs Reviewed - No data to display  Imaging Review No  results found. I have personally reviewed and evaluated these images and lab results as part of my medical decision-making.   EKG Interpretation None      MDM   Final diagnoses:  Chronic back pain   She was prescribed lidoderm patches when seen here 2/14 with no improvement in pain. Saw her on 2/20 at which time we discussed steroid taper, but was hesitant due to DM.  Pt endorses her blood glucose levels have been very well controlled in the 100-150 range.  Will give a short 50 mg to 10 mg taper, advised to watch cbg's closely.   Pt given dilaudid IM injection with some improvement in pain.  No neuro deficit on exam or by history to suggest emergent or surgical presentation. Discussed worsened sx that should prompt immediate re-evaluation including distal weakness, bowel/bladder retention/incontinence. Advised f/u with Dr. Gerilyn Pilgrim as planned.            Burgess Amor, PA-C 11/10/15 1239  Samuel Jester, DO 11/13/15 1227

## 2015-12-03 ENCOUNTER — Encounter (HOSPITAL_COMMUNITY): Payer: Self-pay | Admitting: Emergency Medicine

## 2015-12-03 ENCOUNTER — Emergency Department (HOSPITAL_COMMUNITY)
Admission: EM | Admit: 2015-12-03 | Discharge: 2015-12-04 | Disposition: A | Discharge status: Home / Self Care | Payer: Medicare Other | Attending: Emergency Medicine | Admitting: Emergency Medicine

## 2015-12-03 DIAGNOSIS — M549 Dorsalgia, unspecified: Secondary | ICD-10-CM | POA: Insufficient documentation

## 2015-12-03 DIAGNOSIS — F319 Bipolar disorder, unspecified: Secondary | ICD-10-CM | POA: Diagnosis not present

## 2015-12-03 DIAGNOSIS — Z794 Long term (current) use of insulin: Secondary | ICD-10-CM | POA: Diagnosis not present

## 2015-12-03 DIAGNOSIS — T404X5A Adverse effect of other synthetic narcotics, initial encounter: Secondary | ICD-10-CM | POA: Diagnosis not present

## 2015-12-03 DIAGNOSIS — R000 Tachycardia, unspecified: Secondary | ICD-10-CM | POA: Insufficient documentation

## 2015-12-03 DIAGNOSIS — I10 Essential (primary) hypertension: Secondary | ICD-10-CM | POA: Diagnosis not present

## 2015-12-03 DIAGNOSIS — T887XXA Unspecified adverse effect of drug or medicament, initial encounter: Secondary | ICD-10-CM

## 2015-12-03 DIAGNOSIS — Z79891 Long term (current) use of opiate analgesic: Secondary | ICD-10-CM | POA: Diagnosis not present

## 2015-12-03 DIAGNOSIS — E119 Type 2 diabetes mellitus without complications: Secondary | ICD-10-CM | POA: Diagnosis not present

## 2015-12-03 DIAGNOSIS — L299 Pruritus, unspecified: Secondary | ICD-10-CM | POA: Insufficient documentation

## 2015-12-03 DIAGNOSIS — F17210 Nicotine dependence, cigarettes, uncomplicated: Secondary | ICD-10-CM | POA: Insufficient documentation

## 2015-12-03 MED ORDER — DIPHENHYDRAMINE HCL 50 MG/ML IJ SOLN
25.0000 mg | Freq: Once | INTRAMUSCULAR | Status: AC
  Administered 2015-12-03: 25 mg via INTRAVENOUS
  Filled 2015-12-03: qty 1

## 2015-12-03 MED ORDER — METHYLPREDNISOLONE SODIUM SUCC 125 MG IJ SOLR
125.0000 mg | Freq: Once | INTRAMUSCULAR | Status: AC
  Administered 2015-12-03: 125 mg via INTRAVENOUS
  Filled 2015-12-03: qty 2

## 2015-12-03 MED ORDER — FAMOTIDINE IN NACL 20-0.9 MG/50ML-% IV SOLN
20.0000 mg | Freq: Once | INTRAVENOUS | Status: AC
  Administered 2015-12-03: 20 mg via INTRAVENOUS
  Filled 2015-12-03: qty 50

## 2015-12-03 NOTE — ED Provider Notes (Signed)
CSN: [id number]     Arrival date & time 12/03/15  1957 History   First MD Initiated Contact with Patient 12/03/15 2157     Chief Complaint  Patient presents with  . Pruritis     (Consider location/radiation/quality/duration/timing/severity/associated sxs/prior Treatment) HPI Comments: Patient is a 45 year old female who presents to the emergency department with complaint of allergic reaction.  The patient states she has been on buprenorphine  Patches for pain. Her insurance will no longer pay for these. Her doctor switched her to a tablet form. She states that starting Thursday, March 16 she began to have itching, mostly in the face, but also on the extremities. She later felt as though she had blisters on her tongue, and her tongue felt "funny". She feels as though her voice is more raspy than usual. She was seen by her neurologist/pain specialist, given an injection of Phenergan, because the Benadryl and Vistaril were not working for her. She states that this has not helped, she feels like she is getting worse, and presents to the emergency department now for assistance with this problem.  The history is provided by the patient.    Past Medical History  Diagnosis Date  . Disc disorder of cervical region   . Hypertension   . High cholesterol   . Mood disorder (HCC)   . Diabetes mellitus   . Diabetes mellitus, type II (HCC)   . Bipolar disorder (HCC)   . Anxiety   . Back pain   . Pain management    Past Surgical History  Procedure Laterality Date  . Cholecystectomy    . Tubal ligation     Family History  Problem Relation Age of Onset  . Diabetes Father   . Heart failure Father   . Stroke Father   . Alcohol abuse Father   . Depression Mother   . Depression Brother   . Depression Son   . Depression Son    Social History  Substance Use Topics  . Smoking status: Current Every Day Smoker -- 0.50 packs/day for 25 years    Types: Cigarettes  . Smokeless tobacco: Never Used   . Alcohol Use: No   OB History    Gravida Para Term Preterm AB TAB SAB Ectopic Multiple Living   4 4  4      4      Review of Systems  Musculoskeletal: Positive for back pain.  Skin: Positive for rash.  Psychiatric/Behavioral: The patient is nervous/anxious.   All other systems reviewed and are negative.     Allergies  Wheat; Soybean-containing drug products; Tape; Codeine; and Compazine  Home Medications   Prior to Admission medications   Medication Sig Start Date End Date Taking? Authorizing Provider  ALPRAZolam Prudy Feeler(XANAX) 1 MG tablet Take 1 tablet (1 mg total) by mouth 3 (three) times daily as needed for anxiety. 04/10/15   Tammy Triplett, PA-C  butalbital-acetaminophen-caffeine (FIORICET, ESGIC) 50-325-40 MG per tablet Take 1-2 tablets by mouth every 6 (six) hours as needed for headache or migraine.  03/22/15   Historical Provider, MD  BUTRANS 10 MCG/HR PTWK patch Place 10 mcg onto the skin every 7 (seven) days. 03/27/15   Historical Provider, MD  carisoprodol (SOMA) 350 MG tablet Take 1 tablet (350 mg total) by mouth 3 (three) times daily. 11/05/15   Burgess AmorJulie Idol, PA-C  diclofenac (VOLTAREN) 75 MG EC tablet Take 1 tablet (75 mg total) by mouth 2 (two) times daily. Take with food 04/10/15   Pauline Ausammy Triplett, PA-C  insulin glargine (LANTUS) 100 UNIT/ML injection Inject 60 Units into the skin at bedtime.     Historical Provider, MD  insulin lispro (HUMALOG) 100 UNIT/ML injection Inject 16-22 Units into the skin 3 (three) times daily before meals. Per sliding scale. Pt will not exceed 15 units.    Historical Provider, MD  lidocaine (LIDODERM) 5 % Place 1 patch onto the skin daily. Remove & Discard patch within 12 hours or as directed by MD 10/30/15   Burgess Amor, PA-C  Lurasidone HCl (LATUDA) 60 MG TABS Take 60 mg by mouth daily. Patient not taking: Reported on 04/10/2015 09/26/14   Myrlene Broker, MD  LYRICA 200 MG capsule Take 200 mg by mouth 2 (two) times daily. 03/22/15   Historical Provider, MD   oxyCODONE-acetaminophen (PERCOCET/ROXICET) 5-325 MG per tablet Take 1 tablet by mouth every 4 (four) hours as needed. Patient not taking: Reported on 04/10/2015 08/16/14   Tammy Triplett, PA-C  predniSONE (DELTASONE) 10 MG tablet 5, 4, 3, 2 then 1 tablet by mouth daily for 5 days total. 11/10/15   Burgess Amor, PA-C  topiramate (TOPAMAX) 50 MG tablet Take 50 mg by mouth 2 (two) times daily. 03/22/15   Historical Provider, MD  venlafaxine XR (EFFEXOR XR) 150 MG 24 hr capsule Take 1 capsule (150 mg total) by mouth 2 (two) times daily. Patient not taking: Reported on 04/10/2015 09/26/14   Myrlene Broker, MD  zolpidem (AMBIEN) 10 MG tablet Take 1 tablet (10 mg total) by mouth at bedtime as needed for sleep. Patient not taking: Reported on 04/10/2015 09/26/14 10/26/14  Myrlene Broker, MD   BP 129/74 mmHg  Pulse 106  Temp(Src) 98.2 F (36.8 C) (Oral)  Resp 20  Ht  (1.626 m)  Wt 70.761 kg  BMI 26.76 kg/m2  SpO2 98%  LMP 11/12/2015 Physical Exam  Constitutional: She is oriented to person, place, and time. She appears well-developed and well-nourished.  Non-toxic appearance.  HENT:  Head: Normocephalic.  Right Ear: Tympanic membrane and external ear normal.  Left Ear: Tympanic membrane and external ear normal.  There is a macular papular rash of the 4 head and face.  Eyes: EOM and lids are normal. Pupils are equal, round, and reactive to light.  Neck: Normal range of motion. Neck supple. Carotid bruit is not present. No tracheal deviation present.  The trachea is in the midline. There is no stridor appreciated.  Cardiovascular: Normal rate, regular rhythm, normal heart sounds, intact distal pulses and normal pulses.   Pulmonary/Chest: Breath sounds normal. No stridor. No respiratory distress.  Abdominal: Soft. Bowel sounds are normal. There is no tenderness. There is no guarding.  Musculoskeletal: Normal range of motion.  Lymphadenopathy:       Head (right side): No submandibular adenopathy  present.       Head (left side): No submandibular adenopathy present.    She has no cervical adenopathy.  Neurological: She is alert and oriented to person, place, and time. She has normal strength. No cranial nerve deficit or sensory deficit.  Skin: Skin is warm and dry.  There are a few red raised areas of the upper and lower extremities. No hives noted.  Psychiatric: She has a normal mood and affect. Her speech is normal.  Nursing note and vitals reviewed.   ED Course  Procedures (including critical care time) Labs Review Labs Reviewed - No data to display  Imaging Review No results found. I have personally reviewed and evaluated these images and lab  results as part of my medical decision-making.   EKG Interpretation None      MDM  Tachycardia present, but the other vital signs are within normal limits. The pulse oximetry is 98% on room air. The patient speaks in complete sentences without problem.  Patient treated with intravenous Solu-Medrol, Benadryl, and Pepcid.  12:27am. Pt states itching has improved. She feels better. Speaks in complete sentences. No difficulty with breathing. No stridor.   Final diagnoses:  Non-dose-related adverse reaction to medication, initial encounter    *I have reviewed nursing notes, vital signs, and all appropriate lab and imaging results for this patient.[address] PA-C 12/04/15 1610  Glynn Octave, MD 12/04/15 [phone]

## 2015-12-03 NOTE — ED Notes (Signed)
PT states she is having itching on face and in ears due to a recent change in medication.  Saw PCP today and was given phenergan and sent home.

## 2015-12-04 NOTE — Discharge Instructions (Signed)
Please stop your oral buprenorphine until seen by Dr [place]ilyn [place]. Use benadryl and visaril for itching.

## 2015-12-04 NOTE — ED Notes (Signed)
Pt states understanding of care given and follow up instructions.  Ambulated from ED  

## 2016-01-02 ENCOUNTER — Emergency Department (HOSPITAL_COMMUNITY)
Admission: EM | Admit: 2016-01-02 | Discharge: 2016-01-02 | Disposition: A | Discharge status: Home / Self Care | Payer: Medicare Other | Attending: Emergency Medicine | Admitting: Emergency Medicine

## 2016-01-02 ENCOUNTER — Encounter (HOSPITAL_COMMUNITY): Payer: Self-pay | Admitting: Emergency Medicine

## 2016-01-02 DIAGNOSIS — F319 Bipolar disorder, unspecified: Secondary | ICD-10-CM | POA: Insufficient documentation

## 2016-01-02 DIAGNOSIS — Z794 Long term (current) use of insulin: Secondary | ICD-10-CM | POA: Insufficient documentation

## 2016-01-02 DIAGNOSIS — L0291 Cutaneous abscess, unspecified: Secondary | ICD-10-CM

## 2016-01-02 DIAGNOSIS — E119 Type 2 diabetes mellitus without complications: Secondary | ICD-10-CM | POA: Diagnosis not present

## 2016-01-02 DIAGNOSIS — L02416 Cutaneous abscess of left lower limb: Secondary | ICD-10-CM | POA: Diagnosis present

## 2016-01-02 DIAGNOSIS — I10 Essential (primary) hypertension: Secondary | ICD-10-CM | POA: Insufficient documentation

## 2016-01-02 DIAGNOSIS — M546 Pain in thoracic spine: Secondary | ICD-10-CM | POA: Insufficient documentation

## 2016-01-02 DIAGNOSIS — L0390 Cellulitis, unspecified: Secondary | ICD-10-CM

## 2016-01-02 DIAGNOSIS — S39012A Strain of muscle, fascia and tendon of lower back, initial encounter: Secondary | ICD-10-CM

## 2016-01-02 DIAGNOSIS — F17210 Nicotine dependence, cigarettes, uncomplicated: Secondary | ICD-10-CM | POA: Diagnosis not present

## 2016-01-02 MED ORDER — SULFAMETHOXAZOLE-TRIMETHOPRIM 800-160 MG PO TABS
1.0000 | ORAL_TABLET | Freq: Once | ORAL | Status: AC
  Administered 2016-01-02: 1 via ORAL
  Filled 2016-01-02: qty 1

## 2016-01-02 MED ORDER — LIDOCAINE-EPINEPHRINE (PF) 1 %-1:200000 IJ SOLN
10.0000 mL | Freq: Once | INTRAMUSCULAR | Status: DC
  Filled 2016-01-02: qty 10

## 2016-01-02 MED ORDER — SULFAMETHOXAZOLE-TRIMETHOPRIM 800-160 MG PO TABS: 1.0000 | ORAL_TABLET | Freq: Two times a day (BID) | ORAL | Status: AC

## 2016-01-02 MED ORDER — KETOROLAC TROMETHAMINE 30 MG/ML IJ SOLN
60.0000 mg | Freq: Once | INTRAMUSCULAR | Status: AC
  Administered 2016-01-02: 60 mg via INTRAMUSCULAR
  Filled 2016-01-02: qty 2

## 2016-01-02 MED ORDER — IBUPROFEN 800 MG PO TABS: 800.0000 mg | ORAL_TABLET | Freq: Three times a day (TID) | ORAL | Status: AC | PRN

## 2016-01-02 MED ORDER — LIDOCAINE-EPINEPHRINE (PF) 1 %-1:200000 IJ SOLN
INTRAMUSCULAR | Status: AC
  Filled 2016-01-02: qty 30

## 2016-01-02 NOTE — ED Provider Notes (Signed)
By signing my name below, I, Iona Beard, attest that this documentation has been prepared under the direction and in the presence of Ashira Kirsten N Inis Borneman, DO.   Electronically Signed: Iona Beard, ED Scribe. 01/02/2016. 1:02 AM  TIME SEEN: 1:07 AM  CHIEF COMPLAINT: Back Pain, abscess  HPI: HPI Comments: Jaclyn Cruz is a 46 y.o. female with history of hypertension, diabetes, hyperlipidemia, chronic back pain who presents to the Emergency Department complaining of gradual onset, intermittent, abscess on inner left thigh, ongoing for a few days. Pt reports associated pain to the area. No drainage from this area. No fever.   Pt also complains of upper right back pain, ongoing for several days after lifting something heavy. States she thinks she strained her back. No other associated symptoms noted. Pt reports back pain is worsened with movement. Pain from abscess is worsened with palpation. No other associated symptoms noted. Pt denies numbness, tingling, weakness, urinary incontinence, bowel incontinence, difficulty walking.   ROS: See HPI Constitutional: no fever  Eyes: no drainage  ENT: no runny nose   Cardiovascular:  no chest pain  Resp: no SOB  GI: no vomiting GU: no dysuria Integumentary: no rash  Allergy: no hives  Musculoskeletal: no leg swelling  Neurological: no slurred speech ROS otherwise negative  PAST MEDICAL HISTORY/PAST SURGICAL HISTORY:  Past Medical History  Diagnosis Date  . Disc disorder of cervical region   . Hypertension   . High cholesterol   . Mood disorder (HCC)   . Diabetes mellitus   . Diabetes mellitus, type II (HCC)   . Bipolar disorder (HCC)   . Anxiety   . Back pain   . Pain management     MEDICATIONS:  Prior to Admission medications   Medication Sig Start Date End Date Taking? Authorizing Provider  butalbital-acetaminophen-caffeine (FIORICET, ESGIC) 50-325-40 MG per tablet Take 1-2 tablets by mouth every 6 (six) hours as needed  for headache or migraine.  03/22/15  Yes Historical Provider, MD  diclofenac (VOLTAREN) 75 MG EC tablet Take 1 tablet (75 mg total) by mouth 2 (two) times daily. Take with food 04/10/15  Yes Tammy Triplett, PA-C  insulin glargine (LANTUS) 100 UNIT/ML injection Inject 60 Units into the skin at bedtime.    Yes Historical Provider, MD  insulin lispro (HUMALOG) 100 UNIT/ML injection Inject 16-22 Units into the skin 3 (three) times daily before meals. Per sliding scale. Pt will not exceed 15 units.   Yes Historical Provider, MD  lidocaine (LIDODERM) 5 % Place 1 patch onto the skin daily. Remove & Discard patch within 12 hours or as directed by MD 10/30/15  Yes Raynelle Fanning Idol, PA-C  LYRICA 200 MG capsule Take 200 mg by mouth 2 (two) times daily. 03/22/15  Yes Historical Provider, MD  oxyCODONE-acetaminophen (PERCOCET/ROXICET) 5-325 MG per tablet Take 1 tablet by mouth every 4 (four) hours as needed. 08/16/14  Yes Tammy Triplett, PA-C  rOPINIRole (REQUIP) 1 MG tablet Take 1 mg by mouth 3 (three) times daily.   Yes Historical Provider, MD  topiramate (TOPAMAX) 50 MG tablet Take 50 mg by mouth 2 (two) times daily. 03/22/15  Yes Historical Provider, MD  ALPRAZolam Prudy Feeler) 1 MG tablet Take 1 tablet (1 mg total) by mouth 3 (three) times daily as needed for anxiety. 04/10/15   Tammy Triplett, PA-C  carisoprodol (SOMA) 350 MG tablet Take 1 tablet (350 mg total) by mouth 3 (three) times daily. 11/05/15   Burgess Amor, PA-C  Lurasidone HCl (LATUDA) 60 MG TABS  Take 60 mg by mouth daily. Patient not taking: Reported on 04/10/2015 09/26/14   Myrlene Brokereborah R Ross, MD  predniSONE (DELTASONE) 10 MG tablet 5, 4, 3, 2 then 1 tablet by mouth daily for 5 days total. 11/10/15   Burgess AmorJulie Idol, PA-C  venlafaxine XR (EFFEXOR XR) 150 MG 24 hr capsule Take 1 capsule (150 mg total) by mouth 2 (two) times daily. Patient not taking: Reported on 04/10/2015 09/26/14   Myrlene Brokereborah R Ross, MD  zolpidem (AMBIEN) 10 MG tablet Take 1 tablet (10 mg total) by mouth at bedtime  as needed for sleep. Patient not taking: Reported on 04/10/2015 09/26/14 10/26/14  Myrlene Brokereborah R Ross, MD    ALLERGIES:  Allergies  Allergen Reactions  . Wheat Shortness Of Breath and Other (See Comments)    Causes severe coughing  . Butrans [Buprenorphine]   . Soybean-Containing Drug Products   . Tape Other (See Comments)    ALLERGIC to BANDAGES: causes infection to area that is exposed  . Codeine Itching, Swelling and Rash  . Compazine Itching and Rash    SOCIAL HISTORY:  Social History  Substance Use Topics  . Smoking status: Current Every Day Smoker -- 0.50 packs/day for 25 years    Types: Cigarettes  . Smokeless tobacco: Never Used  . Alcohol Use: No    FAMILY HISTORY: Family History  Problem Relation Age of Onset  . Diabetes Father   . Heart failure Father   . Stroke Father   . Alcohol abuse Father   . Depression Mother   . Depression Brother   . Depression Son   . Depression Son     EXAM: BP 162/69 mmHg  Pulse 109  Temp(Src) 97.8 F (36.6 C)  Resp 20  Ht 5\' 4"  (1.626 m)  Wt 157 lb (71.215 kg)  BMI 26.94 kg/m2  SpO2 98%  LMP 11/10/2015 CONSTITUTIONAL: Alert and oriented and responds appropriately to questions. Chronically ill-appearing HEAD: Normocephalic EYES: Conjunctivae clear, PERRL ENT: normal nose; no rhinorrhea; moist mucous membranes NECK: Supple, no meningismus, no LAD  CARD: RRR; S1 and S2 appreciated; no murmurs, no clicks, no rubs, no gallops RESP: Normal chest excursion without splinting or tachypnea; breath sounds clear and equal bilaterally; no wheezes, no rhonchi, no rales, no hypoxia or respiratory distress, speaking full sentences ABD/GI: Normal bowel sounds; non-distended; soft, non-tender, no rebound, no guarding, no peritoneal signs BACK:  The back appears normal and is tender to palpation over the thoracic paraspinal musculature on right side, no midline spinal TTP, step off, or deformity, there is no CVA tenderness EXT: Normal ROM in  all joints; non-tender to palpation; no edema; normal capillary refill; no cyanosis, no calf tenderness or swelling    SKIN: Normal color for age and race; warm; no rash; 3 x 3 cm fluctuant area to the left inner thigh with 4 cm area of surrounding erythema and warmth, without induration or subcutaneous gas  NEURO: Moves all extremities equally, sensation to light touch intact diffusely, cranial nerves II through XII intact, normal gait, no saddle anesthesia PSYCH: The patient's mood and manner are appropriate. Grooming and personal hygiene are appropriate.  MEDICAL DECISION MAKING: Patient here with 2 different complaints. She is complaining of right thoracic back strain after lifting something heavy. No midline tenderness. No neurologic deficits. No red flag symptoms. I do not feel she needs emergent imaging of her back. Will treat with Toradol and discharged with ibuprofen. Patient also complaining of an abscess to the left inner thigh cellulitis  here as well. No subcutaneous gas. I will incise and drain this abscess and place her on Bactrim.  ED PROGRESS: Patient reports feeling better after Toradol. She is ambulatory. Abscess on the left thigh has been drained with a small amount of purulent drainage. I have advised her to continue warm compresses. We'll have her continue Bactrim. She has a PCP for follow-up and is also requesting information for OB/GYN. We'll provide her with this information.  At this time, I do not feel there is any life-threatening condition present. I have reviewed and discussed all results (EKG, imaging, lab, urine as appropriate), exam findings with patient. I have reviewed nursing notes and appropriate previous records.  I feel the patient is safe to be discharged home without further emergent workup. Discussed usual and customary return precautions. Patient and family (if present) verbalize understanding and are comfortable with this plan.  Patient will follow-up with their  primary care provider. If they do not have a primary care provider, information for follow-up has been provided to them. All questions have been answered.     INCISION AND DRAINAGE Performed by: Raelyn Number Consent: Verbal consent obtained. Risks and benefits: risks, benefits and alternatives were discussed Type: abscess  Body area: left inner thigh   Anesthesia: local infiltration  Incision was made with a scalpel.  Local anesthetic: lidocaine 2% with epinephrine  Anesthetic total: 4 ml  Complexity: complex Blunt dissection to break up loculations  Drainage: purulent  Drainage amount: small  Packing material:none  Patient tolerance: Patient tolerated the procedure well with no immediate complications.     I personally performed the services described in this documentation, which was scribed in my presence. The recorded information has been reviewed and is accurate.    Layla Maw Kristalynn Coddington, DO 01/02/16 [phone]

## 2016-01-02 NOTE — ED Notes (Signed)
Pt alert & oriented x4, stable gait. Patient given discharge instructions, paperwork & prescription(s). Patient  instructed to stop at the registration desk to finish any additional paperwork. Patient verbalized understanding. Pt left department w/ no further questions. 

## 2016-01-02 NOTE — ED Notes (Signed)
Pt c/o middle back pain since lifting buckets of water. Pt also c/o abscess to left inner thigh.

## 2016-01-02 NOTE — Discharge Instructions (Signed)
Cellulitis °Cellulitis is an infection of the skin and the tissue beneath it. The infected area is usually red and tender. Cellulitis occurs most often in the arms and lower legs.  °CAUSES  °Cellulitis is caused by bacteria that enter the skin through cracks or cuts in the skin. The most common types of bacteria that cause cellulitis are staphylococci and streptococci. °SIGNS AND SYMPTOMS  °· Redness and warmth. °· Swelling. °· Tenderness or pain. °· Fever. °DIAGNOSIS  °Your health care provider can usually determine what is wrong based on a physical exam. Blood tests may also be done. °TREATMENT  °Treatment usually involves taking an antibiotic medicine. °HOME CARE INSTRUCTIONS  °· Take your antibiotic medicine as directed by your health care provider. Finish the antibiotic even if you start to feel better. °· Keep the infected arm or leg elevated to reduce swelling. °· Apply a warm cloth to the affected area up to 4 times per day to relieve pain. °· Take medicines only as directed by your health care provider. °· Keep all follow-up visits as directed by your health care provider. °SEEK MEDICAL CARE IF:  °· You notice red streaks coming from the infected area. °· Your red area gets larger or turns dark in color. °· Your bone or joint underneath the infected area becomes painful after the skin has healed. °· Your infection returns in the same area or another area. °· You notice a swollen bump in the infected area. °· You develop new symptoms. °· You have a fever. °SEEK IMMEDIATE MEDICAL CARE IF:  °· You feel very sleepy. °· You develop vomiting or diarrhea. °· You have a general ill feeling (malaise) with muscle aches and pains. °  °This information is not intended to replace advice given to you by your health care provider. Make sure you discuss any questions you have with your health care provider. °  °Document Released: 06/11/2005 Document Revised: 05/23/2015 Document Reviewed: 11/17/2011 °Elsevier Interactive  Patient Education ©2016 Elsevier Inc. ° °Abscess °An abscess is an infected area that contains a collection of pus and debris. It can occur in almost any part of the body. An abscess is also known as a furuncle or boil. °CAUSES  °An abscess occurs when tissue gets infected. This can occur from blockage of oil or sweat glands, infection of hair follicles, or a minor injury to the skin. As the body tries to fight the infection, pus collects in the area and creates pressure under the skin. This pressure causes pain. People with weakened immune systems have difficulty fighting infections and get certain abscesses more often.  °SYMPTOMS °Usually an abscess develops on the skin and becomes a painful mass that is red, warm, and tender. If the abscess forms under the skin, you may feel a moveable soft area under the skin. Some abscesses break open (rupture) on their own, but most will continue to get worse without care. The infection can spread deeper into the body and eventually into the bloodstream, causing you to feel ill.  °DIAGNOSIS  °Your caregiver will take your medical history and perform a physical exam. A sample of fluid may also be taken from the abscess to determine what is causing your infection. °TREATMENT  °Your caregiver may prescribe antibiotic medicines to fight the infection. However, taking antibiotics alone usually does not cure an abscess. Your caregiver may need to make a small cut (incision) in the abscess to drain the pus. In some cases, gauze is packed into the abscess to reduce   pain and to continue draining the area. HOME CARE INSTRUCTIONS   Only take over-the-counter or prescription medicines for pain, discomfort, or fever as directed by your caregiver.  If you were prescribed antibiotics, take them as directed. Finish them even if you start to feel better.  If gauze is used, follow your caregiver's directions for changing the gauze.  To avoid spreading the infection:  Keep your  draining abscess covered with a bandage.  Wash your hands well.  Do not share personal care items, towels, or whirlpools with others.  Avoid skin contact with others.  Keep your skin and clothes clean around the abscess.  Keep all follow-up appointments as directed by your caregiver. SEEK MEDICAL CARE IF:   You have increased pain, swelling, redness, fluid drainage, or bleeding.  You have muscle aches, chills, or a general ill feeling.  You have a fever. MAKE SURE YOU:   Understand these instructions.  Will watch your condition.  Will get help right away if you are not doing well or get worse.   This information is not intended to replace advice given to you by your health care provider. Make sure you discuss any questions you have with your health care provider.   Document Released: 06/11/2005 Document Revised: 03/02/2012 Document Reviewed: 11/14/2011 Elsevier Interactive Patient Education 2016 [employer]. Thoracic Strain A thoracic strain, which is sometimes called a mid-back strain, is an injury to the muscles or tendons that attach to the upper part of your back behind your chest. This type of injury occurs when a muscle is overstretched or overloaded.  Thoracic strains can range from mild to severe. Mild strains may involve stretching a muscle or tendon without tearing it. These injuries may heal in 1-2 weeks. More severe strains involve tearing of muscle fibers or tendons. These will cause more pain and may take 6-8 weeks to heal. CAUSES This condition may be caused by:  An injury in which a sudden force is placed on the muscle.  Exercising without properly warming up.  Overuse of the muscle.  Improper form during certain movements.  Other injuries that surround or cause stress on the mid-back, causing a strain on the muscles. In some cases, the cause may not be known. RISK FACTORS This injury is more common in:  Athletes.  People with  obesity. SYMPTOMS The main symptom of this condition is pain, especially with movement. Other symptoms include:  Bruising.  Swelling.  Spasm. DIAGNOSIS This condition may be diagnosed with a physical exam. X-rays may be taken to check for a fracture. TREATMENT This condition may be treated with:  Resting and icing the injured area.  Physical therapy. This will involve doing stretching and strengthening exercises.  Medicines for pain and inflammation. HOME CARE INSTRUCTIONS  Rest as needed. Follow instructions from your health care provider about any restrictions on activity.  If directed, apply ice to the injured area:  Put ice in a plastic bag.  Place a towel between your skin and the bag.  Leave the ice on for 20 minutes, 2-3 times per day.  Take over-the-counter and prescription medicines only as told by your health care provider.  Begin doing exercises as told by your health care provider or physical therapist.  Always warm up properly before physical activity or sports.  Bend your knees before you lift heavy objects.  Keep all follow-up visits as told by your health care provider. This is important. SEEK MEDICAL CARE IF:  Your pain is not helped  by medicine.  Your pain, bruising, or swelling is getting worse.  You have a fever. SEEK IMMEDIATE MEDICAL CARE IF:  You have shortness of breath.  You have chest pain.  You develop numbness or weakness in your legs.  You have involuntary loss of urine (urinary incontinence).   This information is not intended to replace advice given to you by your health care provider. Make sure you discuss any questions you have with your health care provider.   Document Released: 11/22/2003 Document Revised: 05/23/2015 Document Reviewed: 10/26/2014 Elsevier Interactive Patient Education 2016 [employer]     [email or web address] [address] [place], [place] [phone]  [phone]    [facility] [email or web address] [address] [place], [place] [phone]    [facility] [address] [place], [place] [phone]   [facility] [email or web address] [address] [place], [place] [phone]   [facility] [email or web address] [address] [place], [place] [phone]   [email or web address] [address] [place], [place] [phone]

## 2016-01-02 NOTE — ED Notes (Signed)
Abscess noted to the left inner thigh.

## 2016-01-02 NOTE — ED Notes (Signed)
Pt states she twisted wrong yesterday morning while lifting bucket of water. Feels she pulled a muscle in her back. Pt says she has an abscess that keeps coming & going.
